# Patient Record
Sex: Female | Born: 1965 | Race: White | Hispanic: No | Marital: Single | Smoking: Former smoker
Health system: Southern US, Community
[De-identification: ages and names within clinical notes are randomized; demographics above are authoritative.]

## PROBLEM LIST (undated history)

## (undated) DIAGNOSIS — H409 Unspecified glaucoma: Secondary | ICD-10-CM

## (undated) DIAGNOSIS — G20 Parkinson's disease: Secondary | ICD-10-CM

## (undated) DIAGNOSIS — F32A Depression, unspecified: Secondary | ICD-10-CM

## (undated) DIAGNOSIS — F259 Schizoaffective disorder, unspecified: Secondary | ICD-10-CM

## (undated) DIAGNOSIS — J45909 Unspecified asthma, uncomplicated: Secondary | ICD-10-CM

## (undated) DIAGNOSIS — I214 Non-ST elevation (NSTEMI) myocardial infarction: Secondary | ICD-10-CM

## (undated) DIAGNOSIS — F250 Schizoaffective disorder, bipolar type: Secondary | ICD-10-CM

## (undated) DIAGNOSIS — F4310 Post-traumatic stress disorder, unspecified: Secondary | ICD-10-CM

## (undated) DIAGNOSIS — Z87891 Personal history of nicotine dependence: Secondary | ICD-10-CM

## (undated) DIAGNOSIS — F29 Unspecified psychosis not due to a substance or known physiological condition: Secondary | ICD-10-CM

## (undated) DIAGNOSIS — I10 Essential (primary) hypertension: Secondary | ICD-10-CM

## (undated) DIAGNOSIS — F419 Anxiety disorder, unspecified: Secondary | ICD-10-CM

## (undated) DIAGNOSIS — F329 Major depressive disorder, single episode, unspecified: Secondary | ICD-10-CM

## (undated) HISTORY — PX: BACK SURGERY: SHX140

## (undated) HISTORY — PX: APPENDECTOMY: SHX54

## (undated) HISTORY — PX: CHOLECYSTECTOMY: SHX55

---

## 2015-11-19 ENCOUNTER — Emergency Department (HOSPITAL_COMMUNITY): Payer: Medicaid Other

## 2015-11-19 ENCOUNTER — Observation Stay (HOSPITAL_COMMUNITY)
Admission: EM | Admit: 2015-11-19 | Discharge: 2015-11-21 | Disposition: A | Discharge status: Skilled Nursing Facility | Payer: Medicaid Other | Attending: Internal Medicine | Admitting: Internal Medicine

## 2015-11-19 ENCOUNTER — Encounter (HOSPITAL_COMMUNITY): Payer: Self-pay

## 2015-11-19 DIAGNOSIS — D649 Anemia, unspecified: Secondary | ICD-10-CM | POA: Diagnosis not present

## 2015-11-19 DIAGNOSIS — IMO0001 Reserved for inherently not codable concepts without codable children: Secondary | ICD-10-CM

## 2015-11-19 DIAGNOSIS — I2510 Atherosclerotic heart disease of native coronary artery without angina pectoris: Secondary | ICD-10-CM

## 2015-11-19 DIAGNOSIS — R000 Tachycardia, unspecified: Secondary | ICD-10-CM | POA: Diagnosis not present

## 2015-11-19 DIAGNOSIS — Z87891 Personal history of nicotine dependence: Secondary | ICD-10-CM | POA: Insufficient documentation

## 2015-11-19 DIAGNOSIS — I2489 Other forms of acute ischemic heart disease: Secondary | ICD-10-CM

## 2015-11-19 DIAGNOSIS — F329 Major depressive disorder, single episode, unspecified: Secondary | ICD-10-CM | POA: Diagnosis not present

## 2015-11-19 DIAGNOSIS — R778 Other specified abnormalities of plasma proteins: Secondary | ICD-10-CM | POA: Insufficient documentation

## 2015-11-19 DIAGNOSIS — H409 Unspecified glaucoma: Secondary | ICD-10-CM | POA: Diagnosis not present

## 2015-11-19 DIAGNOSIS — E878 Other disorders of electrolyte and fluid balance, not elsewhere classified: Secondary | ICD-10-CM

## 2015-11-19 DIAGNOSIS — E876 Hypokalemia: Secondary | ICD-10-CM | POA: Insufficient documentation

## 2015-11-19 DIAGNOSIS — I10 Essential (primary) hypertension: Secondary | ICD-10-CM | POA: Insufficient documentation

## 2015-11-19 DIAGNOSIS — I248 Other forms of acute ischemic heart disease: Secondary | ICD-10-CM

## 2015-11-19 DIAGNOSIS — R079 Chest pain, unspecified: Secondary | ICD-10-CM | POA: Diagnosis present

## 2015-11-19 DIAGNOSIS — F259 Schizoaffective disorder, unspecified: Secondary | ICD-10-CM | POA: Insufficient documentation

## 2015-11-19 DIAGNOSIS — I252 Old myocardial infarction: Secondary | ICD-10-CM | POA: Diagnosis not present

## 2015-11-19 DIAGNOSIS — R030 Elevated blood-pressure reading, without diagnosis of hypertension: Secondary | ICD-10-CM

## 2015-11-19 DIAGNOSIS — R0789 Other chest pain: Secondary | ICD-10-CM | POA: Diagnosis not present

## 2015-11-19 DIAGNOSIS — F419 Anxiety disorder, unspecified: Secondary | ICD-10-CM

## 2015-11-19 DIAGNOSIS — G20 Parkinson's disease: Secondary | ICD-10-CM | POA: Diagnosis not present

## 2015-11-19 DIAGNOSIS — G20A1 Parkinson's disease without dyskinesia, without mention of fluctuations: Secondary | ICD-10-CM

## 2015-11-19 DIAGNOSIS — R7989 Other specified abnormal findings of blood chemistry: Secondary | ICD-10-CM

## 2015-11-19 HISTORY — DX: Unspecified psychosis not due to a substance or known physiological condition: F29

## 2015-11-19 HISTORY — DX: Personal history of nicotine dependence: Z87.891

## 2015-11-19 HISTORY — DX: Non-ST elevation (NSTEMI) myocardial infarction: I21.4

## 2015-11-19 HISTORY — DX: Depression, unspecified: F32.A

## 2015-11-19 HISTORY — DX: Essential (primary) hypertension: I10

## 2015-11-19 HISTORY — DX: Schizoaffective disorder, bipolar type: F25.0

## 2015-11-19 HISTORY — DX: Unspecified asthma, uncomplicated: J45.909

## 2015-11-19 HISTORY — DX: Unspecified glaucoma: H40.9

## 2015-11-19 HISTORY — DX: Schizoaffective disorder, unspecified: F25.9

## 2015-11-19 HISTORY — DX: Post-traumatic stress disorder, unspecified: F43.10

## 2015-11-19 HISTORY — DX: Major depressive disorder, single episode, unspecified: F32.9

## 2015-11-19 HISTORY — DX: Parkinson's disease: G20

## 2015-11-19 HISTORY — DX: Anxiety disorder, unspecified: F41.9

## 2015-11-19 LAB — CBC
HEMATOCRIT: 28.6 % — AB (ref 36.0–46.0)
HEMOGLOBIN: 9.9 g/dL — AB (ref 12.0–15.0)
MCH: 30.2 pg (ref 26.0–34.0)
MCHC: 34.6 g/dL (ref 30.0–36.0)
MCV: 87.2 fL (ref 78.0–100.0)
Platelets: 174 10*3/uL (ref 150–400)
RBC: 3.28 MIL/uL — AB (ref 3.87–5.11)
RDW: 13.1 % (ref 11.5–15.5)
WBC: 6.3 10*3/uL (ref 4.0–10.5)

## 2015-11-19 LAB — BASIC METABOLIC PANEL
ANION GAP: 3 — AB (ref 5–15)
BUN: 8 mg/dL (ref 6–20)
CHLORIDE: 119 mmol/L — AB (ref 101–111)
CO2: 18 mmol/L — ABNORMAL LOW (ref 22–32)
Calcium: 5.8 mg/dL — CL (ref 8.9–10.3)
Creatinine, Ser: 0.36 mg/dL — ABNORMAL LOW (ref 0.44–1.00)
GFR calc Af Amer: >60 mL/min (ref >60)
Glucose, Bld: 82 mg/dL (ref 65–99)
POTASSIUM: 2.7 mmol/L — AB (ref 3.5–5.1)
SODIUM: 140 mmol/L (ref 135–145)

## 2015-11-19 LAB — TROPONIN I
Troponin I: 0.14 ng/mL (ref <0.03)
Troponin I: 0.14 ng/mL (ref <0.03)
Troponin I: 0.14 ng/mL (ref <0.03)

## 2015-11-19 LAB — HEPATIC FUNCTION PANEL
ALT: 11 U/L — ABNORMAL LOW (ref 14–54)
AST: 14 U/L — ABNORMAL LOW (ref 15–41)
Albumin: 4.3 g/dL (ref 3.5–5.0)
Alkaline Phosphatase: 133 U/L — ABNORMAL HIGH (ref 38–126)
Bilirubin, Direct: 0.1 mg/dL (ref 0.1–0.5)
Indirect Bilirubin: 0.7 mg/dL (ref 0.3–0.9)
Total Bilirubin: 0.8 mg/dL (ref 0.3–1.2)
Total Protein: 8.4 g/dL — ABNORMAL HIGH (ref 6.5–8.1)

## 2015-11-19 LAB — URINALYSIS, ROUTINE W REFLEX MICROSCOPIC
Bilirubin Urine: NEGATIVE
Glucose, UA: NEGATIVE mg/dL
Hgb urine dipstick: NEGATIVE
Ketones, ur: NEGATIVE mg/dL
Nitrite: NEGATIVE
Protein, ur: NEGATIVE mg/dL
Specific Gravity, Urine: 1.01 (ref 1.005–1.030)
pH: 6.5 (ref 5.0–8.0)

## 2015-11-19 LAB — URINE MICROSCOPIC-ADD ON: RBC / HPF: NONE SEEN RBC/hpf (ref 0–5)

## 2015-11-19 LAB — MAGNESIUM
Magnesium: 1.3 mg/dL — ABNORMAL LOW (ref 1.7–2.4)
Magnesium: 2 mg/dL (ref 1.7–2.4)

## 2015-11-19 LAB — POC OCCULT BLOOD, ED: FECAL OCCULT BLD: NEGATIVE

## 2015-11-19 LAB — ALBUMIN: Albumin: 2.4 g/dL — ABNORMAL LOW (ref 3.5–5.0)

## 2015-11-19 MED ORDER — BACLOFEN 10 MG PO TABS
5.0000 mg | ORAL_TABLET | Freq: Three times a day (TID) | ORAL | Status: DC
  Administered 2015-11-19 – 2015-11-21 (×5): 5 mg via ORAL
  Filled 2015-11-19 (×5): qty 1

## 2015-11-19 MED ORDER — LORAZEPAM 2 MG/ML IJ SOLN
1.0000 mg | Freq: Once | INTRAMUSCULAR | Status: AC
  Administered 2015-11-19: 1 mg via INTRAVENOUS
  Filled 2015-11-19: qty 1

## 2015-11-19 MED ORDER — POTASSIUM CHLORIDE 10 MEQ/100ML IV SOLN
10.0000 meq | INTRAVENOUS | Status: AC
  Administered 2015-11-19 (×3): 10 meq via INTRAVENOUS
  Filled 2015-11-19 (×3): qty 100

## 2015-11-19 MED ORDER — NITROGLYCERIN 0.4 MG SL SUBL: 0.4000 mg | SUBLINGUAL_TABLET | SUBLINGUAL | Status: DC | PRN

## 2015-11-19 MED ORDER — LORAZEPAM 0.5 MG PO TABS
0.5000 mg | ORAL_TABLET | Freq: Four times a day (QID) | ORAL | Status: DC | PRN
  Administered 2015-11-19 – 2015-11-20 (×2): 0.5 mg via ORAL
  Filled 2015-11-19 (×2): qty 1

## 2015-11-19 MED ORDER — RISAQUAD PO CAPS
1.0000 | ORAL_CAPSULE | Freq: Three times a day (TID) | ORAL | Status: DC
  Administered 2015-11-19 – 2015-11-21 (×5): 1 via ORAL
  Filled 2015-11-19 (×5): qty 1

## 2015-11-19 MED ORDER — IOPAMIDOL (ISOVUE-370) INJECTION 76%
100.0000 mL | Freq: Once | INTRAVENOUS | Status: AC | PRN
  Administered 2015-11-19: 100 mL via INTRAVENOUS

## 2015-11-19 MED ORDER — GABAPENTIN 300 MG PO CAPS
600.0000 mg | ORAL_CAPSULE | Freq: Three times a day (TID) | ORAL | Status: DC
  Administered 2015-11-19 – 2015-11-21 (×5): 600 mg via ORAL
  Filled 2015-11-19 (×5): qty 2

## 2015-11-19 MED ORDER — METOPROLOL TARTRATE 25 MG PO TABS
12.5000 mg | ORAL_TABLET | Freq: Two times a day (BID) | ORAL | Status: DC
  Administered 2015-11-19 – 2015-11-21 (×4): 12.5 mg via ORAL
  Filled 2015-11-19 (×4): qty 1

## 2015-11-19 MED ORDER — POTASSIUM CHLORIDE CRYS ER 20 MEQ PO TBCR
40.0000 meq | EXTENDED_RELEASE_TABLET | Freq: Two times a day (BID) | ORAL | Status: AC
  Administered 2015-11-19 – 2015-11-20 (×2): 40 meq via ORAL
  Filled 2015-11-19 (×2): qty 2

## 2015-11-19 MED ORDER — LATANOPROST 0.005 % OP SOLN
OPHTHALMIC | Status: AC
  Filled 2015-11-19: qty 2.5

## 2015-11-19 MED ORDER — SODIUM CHLORIDE 0.9 % IV SOLN
1.0000 g | Freq: Once | INTRAVENOUS | Status: AC
  Administered 2015-11-19: 1 g via INTRAVENOUS
  Filled 2015-11-19 (×2): qty 10

## 2015-11-19 MED ORDER — MAGNESIUM SULFATE 2 GM/50ML IV SOLN
2.0000 g | Freq: Once | INTRAVENOUS | Status: AC
  Administered 2015-11-19: 2 g via INTRAVENOUS
  Filled 2015-11-19: qty 50

## 2015-11-19 MED ORDER — DORZOLAMIDE HCL-TIMOLOL MAL 2-0.5 % OP SOLN
OPHTHALMIC | Status: AC
  Filled 2015-11-19: qty 10

## 2015-11-19 MED ORDER — CARBIDOPA-LEVODOPA 25-100 MG PO TABS
2.0000 | ORAL_TABLET | Freq: Three times a day (TID) | ORAL | Status: DC
  Administered 2015-11-19 – 2015-11-21 (×5): 2 via ORAL
  Filled 2015-11-19 (×4): qty 2
  Filled 2015-11-19: qty 1

## 2015-11-19 MED ORDER — POTASSIUM CHLORIDE 10 MEQ/100ML IV SOLN
10.0000 meq | INTRAVENOUS | Status: AC
  Administered 2015-11-19 – 2015-11-20 (×4): 10 meq via INTRAVENOUS
  Filled 2015-11-19 (×4): qty 100

## 2015-11-19 MED ORDER — METOPROLOL TARTRATE 5 MG/5ML IV SOLN
5.0000 mg | Freq: Once | INTRAVENOUS | Status: AC
  Administered 2015-11-19: 5 mg via INTRAVENOUS
  Filled 2015-11-19: qty 5

## 2015-11-19 MED ORDER — TRAMADOL HCL 50 MG PO TABS
50.0000 mg | ORAL_TABLET | Freq: Two times a day (BID) | ORAL | Status: DC
  Administered 2015-11-19 – 2015-11-21 (×4): 50 mg via ORAL
  Filled 2015-11-19 (×4): qty 1

## 2015-11-19 MED ORDER — ACETAMINOPHEN 325 MG PO TABS: 650.0000 mg | ORAL_TABLET | ORAL | Status: DC | PRN

## 2015-11-19 MED ORDER — LATANOPROST 0.005 % OP SOLN
1.0000 [drp] | Freq: Every day | OPHTHALMIC | Status: DC
  Administered 2015-11-19 – 2015-11-20 (×2): 1 [drp] via OPHTHALMIC
  Filled 2015-11-19: qty 2.5

## 2015-11-19 MED ORDER — PALIPERIDONE ER 6 MG PO TB24
12.0000 mg | ORAL_TABLET | Freq: Every day | ORAL | Status: DC
  Administered 2015-11-20 – 2015-11-21 (×2): 12 mg via ORAL
  Filled 2015-11-19 (×4): qty 2

## 2015-11-19 MED ORDER — SODIUM CHLORIDE 0.9 % IV SOLN
1.0000 g | Freq: Once | INTRAVENOUS | Status: AC
  Administered 2015-11-19: 1 g via INTRAVENOUS
  Filled 2015-11-19: qty 10

## 2015-11-19 MED ORDER — POTASSIUM CHLORIDE 10 MEQ/50ML IV SOLN: 10.0000 meq | INTRAVENOUS | Status: DC | PRN

## 2015-11-19 MED ORDER — QUETIAPINE FUMARATE 25 MG PO TABS
50.0000 mg | ORAL_TABLET | Freq: Two times a day (BID) | ORAL | Status: DC
  Administered 2015-11-19 – 2015-11-21 (×4): 50 mg via ORAL
  Filled 2015-11-19 (×4): qty 2

## 2015-11-19 MED ORDER — ONDANSETRON HCL 4 MG/2ML IJ SOLN: 4.0000 mg | Freq: Four times a day (QID) | INTRAMUSCULAR | Status: DC | PRN

## 2015-11-19 MED ORDER — DULOXETINE HCL 60 MG PO CPEP
60.0000 mg | ORAL_CAPSULE | Freq: Every day | ORAL | Status: DC
  Administered 2015-11-20 – 2015-11-21 (×2): 60 mg via ORAL
  Filled 2015-11-19 (×3): qty 1

## 2015-11-19 MED ORDER — DORZOLAMIDE HCL 2 % OP SOLN
1.0000 [drp] | Freq: Three times a day (TID) | OPHTHALMIC | Status: DC
  Administered 2015-11-20 – 2015-11-21 (×4): 1 [drp] via OPHTHALMIC
  Filled 2015-11-19: qty 10

## 2015-11-19 MED ORDER — DOCUSATE SODIUM 100 MG PO CAPS
100.0000 mg | ORAL_CAPSULE | Freq: Two times a day (BID) | ORAL | Status: DC
  Administered 2015-11-19 – 2015-11-21 (×4): 100 mg via ORAL
  Filled 2015-11-19 (×4): qty 1

## 2015-11-19 MED ORDER — TRAMADOL HCL 50 MG PO TABS: 50.0000 mg | ORAL_TABLET | Freq: Every day | ORAL | Status: DC | PRN

## 2015-11-19 MED ORDER — POTASSIUM CHLORIDE CRYS ER 20 MEQ PO TBCR
20.0000 meq | EXTENDED_RELEASE_TABLET | ORAL | Status: DC | PRN
  Administered 2015-11-19: 20 meq via ORAL
  Filled 2015-11-19: qty 1

## 2015-11-19 MED ORDER — ASPIRIN 325 MG PO TABS
325.0000 mg | ORAL_TABLET | Freq: Every day | ORAL | Status: DC
  Administered 2015-11-19 – 2015-11-21 (×3): 325 mg via ORAL
  Filled 2015-11-19 (×3): qty 1

## 2015-11-19 MED ORDER — ENOXAPARIN SODIUM 40 MG/0.4ML ~~LOC~~ SOLN
40.0000 mg | SUBCUTANEOUS | Status: DC
  Administered 2015-11-19: 40 mg via SUBCUTANEOUS
  Filled 2015-11-19: qty 0.4

## 2015-11-19 MED ORDER — SODIUM CHLORIDE 0.9 % IV SOLN
1000.0000 mL | Freq: Once | INTRAVENOUS | Status: AC
  Administered 2015-11-19: 1000 mL via INTRAVENOUS

## 2015-11-19 NOTE — ED Triage Notes (Signed)
[facility]. Pt was given two NTG and ASA 324 mg prior to arrival.

## 2015-11-19 NOTE — ED Notes (Signed)
CRITICAL VALUE ALERT  Critical value received:  K 2.7  Ca-5.8                Troponin 0.14  Date of notification:  11/19/2015  Time of notification:  1456    Nurse who received alert:  Gaetano HawthorneJJ Quientin Jent  MD notified (1st page):  Dr. Particia NearingHaviland

## 2015-11-19 NOTE — ED Provider Notes (Signed)
[facility]-EMERGENCY DEPT Provider Note   CSN: [id number] Arrival date & time: 11/19/15  1310  First Provider Contact:  First MD Initiated Contact with Patient 11/19/15 1305        History   Chief Complaint Chief Complaint  Patient presents with  . Chest Pain    HPI Brianna Cunningham is a 50 y.o. female.  Pt has severe parkinson's disease and is a resident of a local nursing home.  She told the snf that she has cp.  She said that she also felt like her heart was flying.  She was given nitro and asa by ems which did help her cp.  The pt said that her cp is better now.  Pt said she had a stress test about 2 years ago at [facility] that was nl.  She has never had a stress test.    Chest Pain   Associated symptoms include palpitations.    Past Medical History:  Diagnosis Date  . Depression   . Parkinson disease (HCC)   . Psychosis   . PTSD (post-traumatic stress disorder)   . Schizo affective schizophrenia [facility])     Patient Active Problem List   Diagnosis Date Noted  . Chest pain 11/19/2015    Past Surgical History:  Procedure Laterality Date  . APPENDECTOMY    . BACK SURGERY    . CESAREAN SECTION    . CHOLECYSTECTOMY      OB History    No data available       Home Medications    Prior to Admission medications   Medication Sig Start Date End Date Taking? Authorizing Provider  acidophilus (RISAQUAD) CAPS capsule Take 1 capsule by mouth 3 (three) times daily.   Yes Historical Provider, MD  baclofen (LIORESAL) 10 MG tablet Take 5 mg by mouth 3 (three) times daily.   Yes Historical Provider, MD  carbidopa-levodopa (SINEMET IR) 25-100 MG tablet Take 2 tablets by mouth 3 (three) times daily.   Yes Historical Provider, MD  docusate sodium (COLACE) 100 MG capsule Take 100 mg by mouth 2 (two) times daily.   Yes Historical Provider, MD  dorzolamide (TRUSOPT) 2 % ophthalmic solution Place 1 drop into both eyes 3 (three) times daily.   Yes Historical Provider, MD  DULoxetine  (CYMBALTA) 60 MG capsule Take 60 mg by mouth daily.   Yes Historical Provider, MD  gabapentin (NEURONTIN) 600 MG tablet Take 600 mg by mouth 3 (three) times daily.   Yes Historical Provider, MD  latanoprost (XALATAN) 0.005 % ophthalmic solution Place 1 drop into both eyes at bedtime.   Yes Historical Provider, MD  LORazepam (ATIVAN) 0.5 MG tablet Take 0.5 mg by mouth every 6 (six) hours as needed for anxiety.   Yes Historical Provider, MD  metoprolol tartrate (LOPRESSOR) 25 MG tablet Take 12.5 mg by mouth 2 (two) times daily.   Yes Historical Provider, MD  nitroGLYCERIN (NITROSTAT) 0.4 MG SL tablet Place 0.4 mg under the tongue every 5 (five) minutes as needed for chest pain.   Yes Historical Provider, MD  paliperidone (INVEGA) 6 MG 24 hr tablet Take 12 mg by mouth daily.   Yes Historical Provider, MD  QUEtiapine (SEROQUEL) 50 MG tablet Take 50 mg by mouth 2 (two) times daily.   Yes Historical Provider, MD  traMADol (ULTRAM) 50 MG tablet Take 50 mg by mouth 2 (two) times daily.   Yes Historical Provider, MD  traMADol (ULTRAM) 50 MG tablet Take 50 mg by mouth daily as needed  for moderate pain.   Yes Historical Provider, MD  Vitamin D, Ergocalciferol, (DRISDOL) 50000 units CAPS capsule Take 50,000 Units by mouth every 7 (seven) days. On Wednesday's.   Yes Historical Provider, MD    Family History No family history on file.  Social History Social History  Substance Use Topics  . Smoking status: Former Games developer  . Smokeless tobacco: Never Used  . Alcohol use No     Allergies   Fish-derived products   Review of Systems Review of Systems  Cardiovascular: Positive for chest pain and palpitations.  All other systems reviewed and are negative.    Physical Exam Updated Vital Signs BP (!) 173/102   Pulse 88   Temp 98.8 F (37.1 C) (Rectal)   Resp 15   LMP  (LMP Unknown) Comment: post menopausal  SpO2 100%   Physical Exam  Constitutional: She is oriented to person, place, and time.  She appears well-developed and well-nourished. She appears distressed.  HENT:  Head: Normocephalic and atraumatic.  Right Ear: External ear normal.  Left Ear: External ear normal.  Nose: Nose normal.  Mouth/Throat: Oropharynx is clear and moist.  Eyes: Conjunctivae and EOM are normal. Pupils are equal, round, and reactive to light.  Neck: Normal range of motion. Neck supple.  Cardiovascular: Regular rhythm.  Tachycardia present.   Pulmonary/Chest: Effort normal and breath sounds normal.  Abdominal: Soft. Bowel sounds are normal.  Genitourinary: Rectal exam shows guaiac negative stool.  Musculoskeletal: Normal range of motion.  Neurological: She is alert and oriented to person, place, and time.  Skin: Skin is warm and dry.     Psychiatric: Her behavior is normal. Judgment and thought content normal. Her mood appears anxious.  Nursing note and vitals reviewed.    ED Treatments / Results  Labs (all labs ordered are listed, but only abnormal results are displayed) Labs Reviewed  BASIC METABOLIC PANEL - Abnormal; Notable for the following:       Result Value   Potassium 2.7 (*)    Chloride 119 (*)    CO2 18 (*)    Creatinine, Ser 0.36 (*)    Calcium 5.8 (*)    Anion gap 3 (*)    All other components within normal limits  CBC - Abnormal; Notable for the following:    RBC 3.28 (*)    Hemoglobin 9.9 (*)    HCT 28.6 (*)    All other components within normal limits  TROPONIN I - Abnormal; Notable for the following:    Troponin I 0.14 (*)    All other components within normal limits  POC OCCULT BLOOD, ED    EKG  EKG Interpretation  Date/Time:  Monday November 19 2015 13:28:50 EDT Ventricular Rate:  104 PR Interval:    QRS Duration: 95 QT Interval:  336 QTC Calculation: 442 R Axis:   87 Text Interpretation:  Sinus tachycardia LAE, consider biatrial enlargement Confirmed by Particia Nearing MD, Delesia Martinek ([id number]) on 11/19/2015 1:47:47 PM       Radiology Dg Chest 2 View  Result  Date: 11/19/2015 CLINICAL DATA:  Chest pain. EXAM: CHEST  2 VIEW COMPARISON:  None. FINDINGS: Hyperexpansion is consistent with emphysema. The cardiopericardial silhouette is within normal limits for size. Bones are diffusely demineralized. Patient is status post extensive thoracolumbar fusion. Telemetry leads overlie the chest. IMPRESSION: Emphysema without acute cardiopulmonary process. Electronically Signed   By: Kennith Center M.D.   On: 11/19/2015 13:48   Ct Angio Chest Pe W And/or Wo Contrast  Result Date: 11/19/2015 CLINICAL DATA:  Acute chest pain. EXAM: CT ANGIOGRAPHY CHEST WITH CONTRAST TECHNIQUE: Multidetector CT imaging of the chest was performed using the standard protocol during bolus administration of intravenous contrast. Multiplanar CT image reconstructions and MIPs were obtained to evaluate the vascular anatomy. CONTRAST:  100 mL of Isovue 370 intravenously. COMPARISON:  Radiographs of same day. FINDINGS: Status post Harrington rod fixation of thoracic spine. No pneumothorax or pleural effusion is noted. 5 mm subpleural nodule is noted in superior segment of left lower lobe best seen on image number 74 series 6. 8 mm subpleural nodule is seen posteriorly in right lower lobe best seen on image number 72 of series 6. There is no definite evidence of pulmonary embolus. There is no evidence of thoracic aortic dissection or aneurysm. No mediastinal mass or adenopathy is noted. Visualized portion of upper abdomen is unremarkable. Review of the MIP images confirms the above findings. IMPRESSION: No evidence of pulmonary embolus. 5 mm subpleural nodule noted in left lower lobe, with 8 mm subpleural nodule seen posteriorly in right lower lobe. Non-contrast chest CT at 6-12 months is recommended. If the nodule is stable at time of repeat CT, then future CT at 18-24 months (from today's scan) is considered optional for low-risk patients, but is recommended for high-risk patients. This recommendation follows  the consensus statement: Guidelines for Management of Incidental Pulmonary Nodules Detected on CT Images:From the Fleischner Society 2017; published online before print (10.1148/radiol.1610960454). Electronically Signed   By: Lupita Raider, M.D.   On: 11/19/2015 15:40    Procedures Procedures (including critical care time)  Medications Ordered in ED Medications  potassium chloride SA (K-DUR,KLOR-CON) CR tablet 20 mEq (20 mEq Oral Given 11/19/15 1628)  potassium chloride 10 mEq in 100 mL IVPB (10 mEq Intravenous New Bag/Given 11/19/15 1724)  0.9 %  sodium chloride infusion (0 mLs Intravenous Stopped 11/19/15 1535)  LORazepam (ATIVAN) injection 1 mg (1 mg Intravenous Given 11/19/15 1327)  LORazepam (ATIVAN) injection 1 mg (1 mg Intravenous Given 11/19/15 1447)  iopamidol (ISOVUE-370) 76 % injection 100 mL (100 mLs Intravenous Contrast Given 11/19/15 1514)  calcium gluconate 1 g in sodium chloride 0.9 % 100 mL IVPB (0 g Intravenous Stopped 11/19/15 1710)  metoprolol (LOPRESSOR) injection 5 mg (5 mg Intravenous Given 11/19/15 1617)     Initial Impression / Assessment and Plan / ED Course  I have reviewed the triage vital signs and the nursing notes.  Pertinent labs & imaging results that were available during my care of the patient were reviewed by me and considered in my medical decision making (see chart for details).  Clinical Course  Value Comment By Time  Calcium: (!!) 5.8 (Reviewed) Jacalyn Lefevre, MD 08/07 1556  Calcium: (!!) 5.8 (Reviewed) Jacalyn Lefevre, MD 08/07 1556   Pt was d/w Dr. Dartha Lodge ([facility]) who asked that I call the cardiologist to see if they want pt at [facility] or if she is ok to stay here.  I spoke with Dr. Allyson Sabal (cardiology) who suggested that she stay here as her cp is gone and she is stable.  If troponins bump, then she can go there.  He rec heparin and nitro.   Final Clinical Impressions(s) / ED Diagnoses   Final diagnoses:  Chest pain, unspecified chest pain type    Hypokalemia  Hypocalcemia  Anemia, unspecified anemia type  Anxiety  Sinus tachycardia (HCC)    New Prescriptions New Prescriptions   No medications on file  Jacalyn LefevreJulie Kennady Zimmerle, MD 11/19/15 2017

## 2015-11-19 NOTE — Progress Notes (Signed)
Call received from Lab. Critical Troponin level 0.14. Dr. Clarnce FlockS. Obgata had been notified.

## 2015-11-19 NOTE — ED Notes (Signed)
CRITICAL VALUE ALERT  Critical value received:  Troponin 0.04  Date of notification:  11/19/2015  Time of notification: [id number]    Nurse who received alert:  JJ Elisse Pennick  Dr. Particia NearingHaviland notified in ER

## 2015-11-19 NOTE — H&P (Signed)
History and Physical  Brianna Cunningham ZOX:[id number] DOB: 01-Nov-1965 DOA: 11/19/2015  Referring physician: ER Physician PCP: No PCP Per Patient  Outpatient Specialists:    Patient coming from: Facility  Chief Complaint: Chest pain  HPI: 50 year old female with psychiatry history, parkinsonian features, prior NSTEMI around March of this year with no further work up as patient was deemed not a good candidate for intervention. Patient was advised to continue medical treatment of NSTEMI. Patient presents with chest pain that started around 11am today. Chest pain was described as heaviness and tightness of the chest but non radiating. Reports associated nausea, SOB and diaphoresis. Chest heaviness is said to be intermittent and would only last for few seconds. First troponin was 0.14. Potassium of 2.7 is noted, calcium of 5.8 and hemoglobin of 9.9. CTA chest is negative for PE, but revealed 5mm and 8mm subpleural nodules. EKG revealed sinus tachycardia with bilateral atrial enlargement.  ED Course: Aspirin  Pertinent labs: As above EKG: Independently reviewed.   Review of Systems:  As in HPI. Negative for fever, visual changes, sore throat, rash, new muscle aches, dysuria, bleeding, v/abdominal pain.  Past Medical History:  Diagnosis Date  . Depression   . Parkinson disease (HCC)   . Psychosis   . PTSD (post-traumatic stress disorder)   . Schizo affective schizophrenia [facility](HCC)     Past Surgical History:  Procedure Laterality Date  . APPENDECTOMY    . BACK SURGERY    . CESAREAN SECTION    . CHOLECYSTECTOMY       reports that she has quit smoking. She has never used smokeless tobacco. She reports that she does not drink alcohol. Her drug history is not on file.  Allergies  Allergen Reactions  . Fish-Derived Products Other (See Comments)    unknown    No family history on file.   Prior to Admission medications   Medication Sig Start Date End Date Taking? Authorizing Provider    acidophilus (RISAQUAD) CAPS capsule Take 1 capsule by mouth 3 (three) times daily.   Yes Historical Provider, MD  baclofen (LIORESAL) 10 MG tablet Take 5 mg by mouth 3 (three) times daily.   Yes Historical Provider, MD  carbidopa-levodopa (SINEMET IR) 25-100 MG tablet Take 2 tablets by mouth 3 (three) times daily.   Yes Historical Provider, MD  docusate sodium (COLACE) 100 MG capsule Take 100 mg by mouth 2 (two) times daily.   Yes Historical Provider, MD  dorzolamide (TRUSOPT) 2 % ophthalmic solution Place 1 drop into both eyes 3 (three) times daily.   Yes Historical Provider, MD  DULoxetine (CYMBALTA) 60 MG capsule Take 60 mg by mouth daily.   Yes Historical Provider, MD  gabapentin (NEURONTIN) 600 MG tablet Take 600 mg by mouth 3 (three) times daily.   Yes Historical Provider, MD  latanoprost (XALATAN) 0.005 % ophthalmic solution Place 1 drop into both eyes at bedtime.   Yes Historical Provider, MD  LORazepam (ATIVAN) 0.5 MG tablet Take 0.5 mg by mouth every 6 (six) hours as needed for anxiety.   Yes Historical Provider, MD  metoprolol tartrate (LOPRESSOR) 25 MG tablet Take 12.5 mg by mouth 2 (two) times daily.   Yes Historical Provider, MD  nitroGLYCERIN (NITROSTAT) 0.4 MG SL tablet Place 0.4 mg under the tongue every 5 (five) minutes as needed for chest pain.   Yes Historical Provider, MD  paliperidone (INVEGA) 6 MG 24 hr tablet Take 12 mg by mouth daily.   Yes Historical Provider, MD  QUEtiapine (SEROQUEL) 50 MG tablet Take 50 mg by mouth 2 (two) times daily.   Yes Historical Provider, MD  traMADol (ULTRAM) 50 MG tablet Take 50 mg by mouth 2 (two) times daily.   Yes Historical Provider, MD  traMADol (ULTRAM) 50 MG tablet Take 50 mg by mouth daily as needed for moderate pain.   Yes Historical Provider, MD  Vitamin D, Ergocalciferol, (DRISDOL) 50000 units CAPS capsule Take 50,000 Units by mouth every 7 (seven) days. On Wednesday's.   Yes Historical Provider, MD    Physical Exam: Vitals:    11/19/15 1620 11/19/15 1621 11/19/15 1630 11/19/15 1700  BP: 140/96  (!) 154/104 (!) 173/102  Pulse:  87 82 88  Resp: 23 24 14 15   Temp:      TempSrc:      SpO2:  100% 100% 100%    Constitutional:  . Appears calm and comfortable. Parkinsonian facie and features Eyes:  . No pallor. No jaundice.  ENMT:  . external ears, nose appear normal Neck:  . Neck is supple. No JVD Respiratory:  . CTA bilaterally, no w/r/r.  . Respiratory effort normal. No retractions or accessory muscle use Cardiovascular:  . S1S2 . No LE extremity edema   Abdomen:  . Abdomen is soft and non tender. Organs are difficult to assess. Neurologic:  . Awake and alert. . Moves all limbs.  Wt Readings from Last 3 Encounters:  No data found for Wt    I have personally reviewed following labs and imaging studies  Labs on Admission:  CBC:  Recent Labs Lab 11/19/15 1357  WBC 6.3  HGB 9.9*  HCT 28.6*  MCV 87.2  PLT 174   Basic Metabolic Panel:  Recent Labs Lab 11/19/15 1357  NA 140  K 2.7*  CL 119*  CO2 18*  GLUCOSE 82  BUN 8  CREATININE 0.36*  CALCIUM 5.8*   Liver Function Tests: No results for input(s): AST, ALT, ALKPHOS, BILITOT, PROT, ALBUMIN in the last 168 hours. No results for input(s): LIPASE, AMYLASE in the last 168 hours. No results for input(s): AMMONIA in the last 168 hours. Coagulation Profile: No results for input(s): INR, PROTIME in the last 168 hours. Cardiac Enzymes:  Recent Labs Lab 11/19/15 1357  TROPONINI 0.14*   BNP (last 3 results) No results for input(s): PROBNP in the last 8760 hours. HbA1C: No results for input(s): HGBA1C in the last 72 hours. CBG: No results for input(s): GLUCAP in the last 168 hours. Lipid Profile: No results for input(s): CHOL, HDL, LDLCALC, TRIG, CHOLHDL, LDLDIRECT in the last 72 hours. Thyroid Function Tests: No results for input(s): TSH, T4TOTAL, FREET4, T3FREE, THYROIDAB in the last 72 hours. Anemia Panel: No results for  input(s): VITAMINB12, FOLATE, FERRITIN, TIBC, IRON, RETICCTPCT in the last 72 hours. Urine analysis: No results found for: COLORURINE, APPEARANCEUR, LABSPEC, PHURINE, GLUCOSEU, HGBUR, BILIRUBINUR, KETONESUR, PROTEINUR, UROBILINOGEN, NITRITE, LEUKOCYTESUR Sepsis Labs: @LABRCNTIP (procalcitonin:4,lacticidven:4) )No results found for this or any previous visit (from the past 240 hour(s)).    Radiological Exams on Admission: Dg Chest 2 View  Result Date: 11/19/2015 CLINICAL DATA:  Chest pain. EXAM: CHEST  2 VIEW COMPARISON:  None. FINDINGS: Hyperexpansion is consistent with emphysema. The cardiopericardial silhouette is within normal limits for size. Bones are diffusely demineralized. Patient is status post extensive thoracolumbar fusion. Telemetry leads overlie the chest. IMPRESSION: Emphysema without acute cardiopulmonary process. Electronically Signed   By: Kennith Center M.D.   On: 11/19/2015 13:48   Ct Angio Chest Pe W And/or  Wo Contrast  Result Date: 11/19/2015 CLINICAL DATA:  Acute chest pain. EXAM: CT ANGIOGRAPHY CHEST WITH CONTRAST TECHNIQUE: Multidetector CT imaging of the chest was performed using the standard protocol during bolus administration of intravenous contrast. Multiplanar CT image reconstructions and MIPs were obtained to evaluate the vascular anatomy. CONTRAST:  100 mL of Isovue 370 intravenously. COMPARISON:  Radiographs of same day. FINDINGS: Status post Harrington rod fixation of thoracic spine. No pneumothorax or pleural effusion is noted. 5 mm subpleural nodule is noted in superior segment of left lower lobe best seen on image number 74 series 6. 8 mm subpleural nodule is seen posteriorly in right lower lobe best seen on image number 72 of series 6. There is no definite evidence of pulmonary embolus. There is no evidence of thoracic aortic dissection or aneurysm. No mediastinal mass or adenopathy is noted. Visualized portion of upper abdomen is unremarkable. Review of the MIP  images confirms the above findings. IMPRESSION: No evidence of pulmonary embolus. 5 mm subpleural nodule noted in left lower lobe, with 8 mm subpleural nodule seen posteriorly in right lower lobe. Non-contrast chest CT at 6-12 months is recommended. If the nodule is stable at time of repeat CT, then future CT at 18-24 months (from today's scan) is considered optional for low-risk patients, but is recommended for high-risk patients. This recommendation follows the consensus statement: Guidelines for Management of Incidental Pulmonary Nodules Detected on CT Images:From the Fleischner Society 2017; published online before print (10.1148/radiol.1610960454). Electronically Signed   By: Lupita Raider, M.D.   On: 11/19/2015 15:40    EKG: Independently reviewed.   Active Problems:   Chest pain   Assessment/Plan 1. Chest pain (History of NSTEMI in March 2017 and Medical treatment recommended 2. Hypokalemia 3. Hypomagnesemia 4. Hypocalcemia   Observation  Cycle cardiac enzymes  Please consult Cardiology in ma  Monitor and replace abnormal electrolytes  Further management will depend on hospital course  DVT prophylaxis: [place] Lovenox Code Status: Full Family Communication: None available Disposition Plan: Back to facility   Consults called: Consult Cardiology in am   Admission status: Observation    Time spent: 60 minutes  Berton Mount, MD  [facility] Pager #: [phone] 7PM-7AM contact night coverage as above   11/19/2015, 6:15 PM

## 2015-11-20 ENCOUNTER — Observation Stay (HOSPITAL_BASED_OUTPATIENT_CLINIC_OR_DEPARTMENT_OTHER): Payer: Medicaid Other

## 2015-11-20 ENCOUNTER — Encounter (HOSPITAL_COMMUNITY): Payer: Self-pay | Admitting: Physician Assistant

## 2015-11-20 DIAGNOSIS — R778 Other specified abnormalities of plasma proteins: Secondary | ICD-10-CM

## 2015-11-20 DIAGNOSIS — R079 Chest pain, unspecified: Secondary | ICD-10-CM

## 2015-11-20 DIAGNOSIS — G20 Parkinson's disease: Secondary | ICD-10-CM

## 2015-11-20 DIAGNOSIS — I2510 Atherosclerotic heart disease of native coronary artery without angina pectoris: Secondary | ICD-10-CM

## 2015-11-20 DIAGNOSIS — E878 Other disorders of electrolyte and fluid balance, not elsewhere classified: Secondary | ICD-10-CM

## 2015-11-20 DIAGNOSIS — I248 Other forms of acute ischemic heart disease: Secondary | ICD-10-CM

## 2015-11-20 DIAGNOSIS — R0789 Other chest pain: Secondary | ICD-10-CM

## 2015-11-20 DIAGNOSIS — D649 Anemia, unspecified: Secondary | ICD-10-CM | POA: Diagnosis not present

## 2015-11-20 DIAGNOSIS — IMO0001 Reserved for inherently not codable concepts without codable children: Secondary | ICD-10-CM

## 2015-11-20 DIAGNOSIS — R7989 Other specified abnormal findings of blood chemistry: Secondary | ICD-10-CM | POA: Diagnosis not present

## 2015-11-20 DIAGNOSIS — R030 Elevated blood-pressure reading, without diagnosis of hypertension: Secondary | ICD-10-CM

## 2015-11-20 LAB — BASIC METABOLIC PANEL
Anion gap: 9 (ref 5–15)
BUN: 8 mg/dL (ref 6–20)
CALCIUM: 9 mg/dL (ref 8.9–10.3)
CO2: 19 mmol/L — ABNORMAL LOW (ref 22–32)
CREATININE: 0.6 mg/dL (ref 0.44–1.00)
Chloride: 108 mmol/L (ref 101–111)
GFR calc Af Amer: >60 mL/min (ref >60)
GLUCOSE: 107 mg/dL — AB (ref 65–99)
POTASSIUM: 5.1 mmol/L (ref 3.5–5.1)
SODIUM: 136 mmol/L (ref 135–145)

## 2015-11-20 LAB — CBC
HCT: 39.5 % (ref 36.0–46.0)
Hemoglobin: 13.8 g/dL (ref 12.0–15.0)
MCH: 30.1 pg (ref 26.0–34.0)
MCHC: 34.9 g/dL (ref 30.0–36.0)
MCV: 86.1 fL (ref 78.0–100.0)
PLATELETS: 268 10*3/uL (ref 150–400)
RBC: 4.59 MIL/uL (ref 3.87–5.11)
RDW: 13.4 % (ref 11.5–15.5)
WBC: 7.1 10*3/uL (ref 4.0–10.5)

## 2015-11-20 LAB — ECHOCARDIOGRAM COMPLETE
AVLVOTPG: 3 mmHg
CHL CUP DOP CALC LVOT VTI: 17.6 cm
CHL CUP MV DEC (S): 320
E decel time: 320 msec
EERAT: 20.72
FS: 35 % (ref 28–44)
Height: 65 in
IV/PV OW: 0.99
LA diam end sys: 21 mm
LADIAMINDEX: 1.49 cm/m2
LASIZE: 21 mm
LAVOLA4C: 19.7 mL
LDCA: 2.01 cm2
LV e' LATERAL: 5.55 cm/s
LVEEAVG: 20.72
LVEEMED: 20.72
LVOT SV: 35 mL
LVOT diameter: 16 mm
LVOT peak vel: 87.9 cm/s
Lateral S' vel: 13.7 cm/s
MVPG: 5 mmHg
MVPKAVEL: 127 m/s
MVPKEVEL: 115 m/s
PW: 7.44 mm — AB (ref 0.6–1.1)
TAPSE: 19.2 mm
TDI e' lateral: 5.55
TDI e' medial: 5.11
Weight: 1560 oz

## 2015-11-20 LAB — TROPONIN I: Troponin I: 0.14 ng/mL (ref <0.03)

## 2015-11-20 MED ORDER — LORAZEPAM 0.5 MG PO TABS
0.5000 mg | ORAL_TABLET | ORAL | Status: DC | PRN
  Administered 2015-11-20 – 2015-11-21 (×6): 0.5 mg via ORAL
  Filled 2015-11-20 (×6): qty 1

## 2015-11-20 MED ORDER — ENOXAPARIN SODIUM 30 MG/0.3ML ~~LOC~~ SOLN
30.0000 mg | SUBCUTANEOUS | Status: DC
  Administered 2015-11-20: 30 mg via SUBCUTANEOUS
  Filled 2015-11-20: qty 0.3

## 2015-11-20 NOTE — Progress Notes (Addendum)
PROGRESS NOTE  Brianna Cunningham VQQ:[id number] DOB: 04-Aug-1965 DOA: 11/19/2015 PCP: No PCP Per Patient  Brief Narrative: [age] yof with a PMHx of NSTEMI 06/2015 presented with complaints of chest pain. While in the ED she was noted to be hypokalemic, hypomagnesemic, and hypocalcemic. CTA chest revealed subpleural nodules. She was admitted for further management of chest pain. Cardiology consulted.   Assessment/Plan: 1. Atypical CP. No evidence of ACS. CTA chest no PE. 2. Demand ischemia. Troponins flat. EGK ST, LAE, no previous. Continue ASA. 3. CAD. S/p STEMI 06/2015, reportedly not a candidate for intervention, medical management recommended(?). No records in Epic or Care Everywhere for review. 4. Hypokalemia. Resolved.  5. Hypocalcemia. Resolved.  6. Hypomagnesemia. Resolved.   7. Normocytic anemia. No previous labs for comparison. 8. Parkinson's disease, psychosis, depression, PTSD, schizoaffective schizophrenia. Continue Sinemet, Seroquel, paliperidone. 9. 5 mm subpleural nodule noted in left lower lobe, with 8 mm subpleural nodule seen posteriorly in right lower lobe. Non-contrast chest CT at 6-12 months (January-August 2018) is recommended   Appears stable at this point. Cardiology is recommended ongoing observation, echocardiogram, possibly noninvasive stress testing.  DVT prophylaxis: SCDs Code Status: Full Family Communication: No family bedside  [facility]  Brendia Sacksaniel Jaeleen Inzunza, MD  [facility] Direct contact: [phone] --Via amion app OR  --[email or web address]; password [email or web address]  7PM-7AM contact night coverage as above 11/20/2015, 4:02 PM  LOS: 0 days   Consultants:  Cardiology  Procedures:  None   Antimicrobials:  None   HPI/Subjective: Feels better. Denies CP, abdominal pain, nausea, vomiting. No shortness of breath. Has not had an appetite for two weeks.   Objective: Vitals:   11/19/15 2044 11/20/15 0113 11/20/15 0510 11/20/15  1507  BP: (!) 166/99 (!) 154/81 (!) 102/50 100/69  Pulse: 92 89  75  Resp: 16 18  20   Temp: 98.6 F (37 C) 98.2 F (36.8 C) 98 F (36.7 C) 98.9 F (37.2 C)  TempSrc: Oral Oral Oral Oral  SpO2: 99% 99% 99% 97%  Weight:      Height:        Intake/Output Summary (Last 24 hours) at 11/20/15 1602 Last data filed at 11/20/15 1504  Gross per 24 hour  Intake              240 ml  Output              700 ml  Net             -460 ml     Filed Weights   11/19/15 1937  Weight: 44.2 kg (97 lb 8 oz)    Exam:    Constitutional:  . Appears calm and comfortable.Mask facies. Respiratory:  . CTA bilaterally, no w/r/r.  . Respiratory effort normal. No retractions or accessory muscle use Cardiovascular:  . RRR, no m/r/g . No LE extremity edema   . Telemetry sinus rhythm   I have personally reviewed following labs and imaging studies:  Troponins 0.14, flat   Potassium normal, calcium within normal limits. Magnesium within normal limits.  CBC unremarkable   Basic metabolic panel unremarkable  Scheduled Meds: . acidophilus  1 capsule Oral TID  . aspirin  325 mg Oral Daily  . baclofen  5 mg Oral TID  . carbidopa-levodopa  2 tablet Oral TID  . docusate sodium  100 mg Oral BID  . dorzolamide  1 drop Both Eyes TID  . DULoxetine  60 mg Oral Daily  . enoxaparin (LOVENOX) injection  30 mg Subcutaneous Q24H  . gabapentin  600 mg Oral TID  . latanoprost  1 drop Both Eyes QHS  . metoprolol tartrate  12.5 mg Oral BID  . paliperidone  12 mg Oral Daily  . QUEtiapine  50 mg Oral BID  . traMADol  50 mg Oral BID   Continuous Infusions:   Principal Problem:   Chest pain Active Problems:   Anemia, unspecified   Demand ischemia (HCC)   CAD (coronary artery disease)   Parkinson's disease (HCC)   LOS: 0 days   Time spent 20 minutes   By signing my name below, I, Cynda Acres, attest that this documentation has been prepared under the direction and in the presence of Brendia Sacks, MD. Electronically signed: Cynda Acres, Scribe 11/20/15 11:00 AM    I personally performed the services described in this documentation. All medical record entries made by the scribe were at my direction. I have reviewed the chart and agree that the record reflects my personal performance and is accurate and complete. Brendia Sacks, MD

## 2015-11-20 NOTE — Consult Note (Signed)
Cardiology Consultation Note    Patient ID: Brianna Cunningham, MRN: [id number], DOB/AGE: [birth year] 50 y.o. Admit date: 11/19/2015   Date of Consult: 11/20/2015 Primary Physician: No PCP Per Patient Primary Cardiologist: New  Chief Complaint: chest pain Reason for Consultation: chest pain, elevated troponin Requesting MD: Dr. Dartha Lodgegbata  HPI: Brianna Archererri Gronau is a 50 y.o. female with history of Parkinson's disease (uses wheelchair), anxiety, depression, former tobacco abuse, schizoaffective schizophrenia per chart, glaucoma and ?prior NSTEMI who [facility]. Per admit H/P, the patient had a NSTEMI around March of this year with no further work up as patient was deemed not a good candidate for intervention. I am not sure where this information was obtained but patient does not recall any details about the event other than that she had the same symptoms she experienced yesterday. She does not know what hospital [facility], [facility] in Care Everywhere. Yesterday she was at her rehab [name] when she developed chest pain across her whole chest while rolling around in her wheelchair. It felt like a pressure, associated with nausea. Nothing made the pain better or worse at the time. She began "shaking like a leaf" - she states her Parkinson's tremors have gotten worse over the last 2 weeks. She denies any SOB or diaphoresis. She stated it lasted for 3 hours, intermittently. (Note some details appear to be reported differently than admitting H/P). She notified the nurse at her facility who found her BP to be high and called EMS. She received 4 baby ASA and 2 SL NTG. She did not find any relief with this immediately, but pain gradually resolved prior to [facility]. In the ER she was markedly hypertensive up to 180/101 with mild sinus tach.   She received saline fluid bolus, calcium gluconate for hypocalcemia (5.8), potassium for  hypokalemia (2.7), magnesium for hypomagnesemia (1.3), IV Lopressor, and IV ativan. Troponins 0.14 x 4. F/u potassium improved. Hgb on arrival was 9.9, repeated this AM and 13.8 with delta check noted. CTA neg for PE, + pulm nodules which IM has recommended OP further f/u for. She currently denies any sx. She states this CP is what she experienced in 06/2015.  Past Medical History:  Diagnosis Date  . Anxiety   . Depression   . Former tobacco use   . Glaucoma   . NSTEMI (non-ST elevated myocardial infarction) (HCC)    a. Per admit H/P from August 2017, pt had NSTEMI 06/2015 tx medically.  . Parkinson disease (HCC)   . Psychosis   . PTSD (post-traumatic stress disorder)   . Schizo affective schizophrenia [facility](HCC)       Surgical History:  Past Surgical History:  Procedure Laterality Date  . APPENDECTOMY    . BACK SURGERY    . CESAREAN SECTION    . CHOLECYSTECTOMY       Home Meds: Prior to Admission medications   Medication Sig Start Date End Date Taking? Authorizing Provider  acidophilus (RISAQUAD) CAPS capsule Take 1 capsule by mouth 3 (three) times daily.   Yes Historical Provider, MD  baclofen (LIORESAL) 10 MG tablet Take 5 mg by mouth 3 (three) times daily.   Yes Historical Provider, MD  carbidopa-levodopa (SINEMET IR) 25-100 MG tablet Take 2 tablets by mouth 3 (three) times daily.   Yes Historical Provider, MD  docusate sodium (COLACE) 100 MG capsule Take 100 mg by mouth 2 (two) times  daily.   Yes Historical Provider, MD  dorzolamide (TRUSOPT) 2 % ophthalmic solution Place 1 drop into both eyes 3 (three) times daily.   Yes Historical Provider, MD  DULoxetine (CYMBALTA) 60 MG capsule Take 60 mg by mouth daily.   Yes Historical Provider, MD  gabapentin (NEURONTIN) 600 MG tablet Take 600 mg by mouth 3 (three) times daily.   Yes Historical Provider, MD  latanoprost (XALATAN) 0.005 % ophthalmic solution Place 1 drop into both eyes at bedtime.   Yes Historical Provider, MD  LORazepam  (ATIVAN) 0.5 MG tablet Take 0.5 mg by mouth every 6 (six) hours as needed for anxiety.   Yes Historical Provider, MD  metoprolol tartrate (LOPRESSOR) 25 MG tablet Take 12.5 mg by mouth 2 (two) times daily.   Yes Historical Provider, MD  nitroGLYCERIN (NITROSTAT) 0.4 MG SL tablet Place 0.4 mg under the tongue every 5 (five) minutes as needed for chest pain.   Yes Historical Provider, MD  paliperidone (INVEGA) 6 MG 24 hr tablet Take 12 mg by mouth daily.   Yes Historical Provider, MD  QUEtiapine (SEROQUEL) 50 MG tablet Take 50 mg by mouth 2 (two) times daily.   Yes Historical Provider, MD  traMADol (ULTRAM) 50 MG tablet Take 50 mg by mouth 2 (two) times daily.   Yes Historical Provider, MD  traMADol (ULTRAM) 50 MG tablet Take 50 mg by mouth daily as needed for moderate pain.   Yes Historical Provider, MD  Vitamin D, Ergocalciferol, (DRISDOL) 50000 units CAPS capsule Take 50,000 Units by mouth every 7 (seven) days. On Wednesday's.   Yes Historical Provider, MD    Inpatient Medications:  . acidophilus  1 capsule Oral TID  . aspirin  325 mg Oral Daily  . baclofen  5 mg Oral TID  . carbidopa-levodopa  2 tablet Oral TID  . docusate sodium  100 mg Oral BID  . dorzolamide  1 drop Both Eyes TID  . DULoxetine  60 mg Oral Daily  . enoxaparin (LOVENOX) injection  30 mg Subcutaneous Q24H  . gabapentin  600 mg Oral TID  . latanoprost  1 drop Both Eyes QHS  . metoprolol tartrate  12.5 mg Oral BID  . paliperidone  12 mg Oral Daily  . QUEtiapine  50 mg Oral BID  . traMADol  50 mg Oral BID      Allergies:  Allergies  Allergen Reactions  . Fish-Derived Products Other (See Comments)    unknown    Social History   Social History  . Marital status: Single    Spouse name: N/A  . Number of children: N/A  . Years of education: N/A   Occupational History  . Not on file.   Social History Main Topics  . Smoking status: Former Games developer  . Smokeless tobacco: Never Used     Comment: Smoked for 25 yrs   . Alcohol use No  . Drug use: No  . Sexual activity: Not on file   Other Topics Concern  . Not on file   Social History Narrative  . No narrative on file     Family History  Problem Relation Age of Onset  . Heart disease Mother     Pt unsure of details  . CAD Father     "hardening of the arteries" at 71, died at 2 of cancer  . Cancer Father      Review of Systems: All other systems reviewed and are otherwise negative except as noted above.  Labs:  Recent Labs  11/19/15 1357 11/19/15 1958 11/19/15 2252 11/20/15 0148  TROPONINI 0.14* 0.14* 0.14* 0.14*   Lab Results  Component Value Date   WBC 7.1 11/20/2015   HGB 13.8 11/20/2015   HCT 39.5 11/20/2015   MCV 86.1 11/20/2015   PLT 268 11/20/2015    Recent Labs Lab 11/19/15 1958 11/20/15 0801  NA  --  136  K  --  5.1  CL  --  108  CO2  --  19*  BUN  --  8  CREATININE  --  0.60  CALCIUM  --  9.0  PROT 8.4*  --   BILITOT 0.8  --   ALKPHOS 133*  --   ALT 11*  --   AST 14*  --   GLUCOSE  --  107*   No results found for: CHOL, HDL, LDLCALC, TRIG No results found for: DDIMER  Radiology/Studies:  Dg Chest 2 View  Result Date: 11/19/2015 CLINICAL DATA:  Chest pain. EXAM: CHEST  2 VIEW COMPARISON:  None. FINDINGS: Hyperexpansion is consistent with emphysema. The cardiopericardial silhouette is within normal limits for size. Bones are diffusely demineralized. Patient is status post extensive thoracolumbar fusion. Telemetry leads overlie the chest. IMPRESSION: Emphysema without acute cardiopulmonary process. Electronically Signed   By: Kennith Center M.D.   On: 11/19/2015 13:48   Ct Angio Chest Pe W And/or Wo Contrast  Result Date: 11/19/2015 CLINICAL DATA:  Acute chest pain. EXAM: CT ANGIOGRAPHY CHEST WITH CONTRAST TECHNIQUE: Multidetector CT imaging of the chest was performed using the standard protocol during bolus administration of intravenous contrast. Multiplanar CT image reconstructions and MIPs were  obtained to evaluate the vascular anatomy. CONTRAST:  100 mL of Isovue 370 intravenously. COMPARISON:  Radiographs of same day. FINDINGS: Status post Harrington rod fixation of thoracic spine. No pneumothorax or pleural effusion is noted. 5 mm subpleural nodule is noted in superior segment of left lower lobe best seen on image number 74 series 6. 8 mm subpleural nodule is seen posteriorly in right lower lobe best seen on image number 72 of series 6. There is no definite evidence of pulmonary embolus. There is no evidence of thoracic aortic dissection or aneurysm. No mediastinal mass or adenopathy is noted. Visualized portion of upper abdomen is unremarkable. Review of the MIP images confirms the above findings. IMPRESSION: No evidence of pulmonary embolus. 5 mm subpleural nodule noted in left lower lobe, with 8 mm subpleural nodule seen posteriorly in right lower lobe. Non-contrast chest CT at 6-12 months is recommended. If the nodule is stable at time of repeat CT, then future CT at 18-24 months (from today's scan) is considered optional for low-risk patients, but is recommended for high-risk patients. This recommendation follows the consensus statement: Guidelines for Management of Incidental Pulmonary Nodules Detected on CT Images:From the Fleischner Society 2017; published online before print (10.1148/radiol.4098119147). Electronically Signed   By: Lupita Raider, M.D.   On: 11/19/2015 15:40    Wt Readings from Last 3 Encounters:  11/19/15 97 lb 8 oz (44.2 kg)    EKG: sinus tach 104bpm, LAE, no acute ST-T changes  Physical Exam: Blood pressure (!) 102/50, pulse 89, temperature 98 F (36.7 C), temperature source Oral, resp. rate 18, height  (1.651 m), weight 97 lb 8 oz (44.2 kg), SpO2 99 %. Body mass index is 16.22 kg/m. General: Thin WF in no acute distress. Lying flat with no sx Head: Normocephalic, atraumatic, sclera non-icteric, no xanthomas, nares are without discharge.  Neck:  Negative  for carotid bruits. JVD not elevated. Lungs: Clear bilaterally to auscultation without wheezes, rales, or rhonchi. Breathing is unlabored. Heart: RRR with S1 S2. No murmurs, rubs, or gallops appreciated. Abdomen: Soft, non-tender, non-distended with normoactive bowel sounds. No hepatomegaly. No rebound/guarding. No obvious abdominal masses. Msk:  Strength and tone appear normal for age. Extremities: No clubbing or cyanosis. No edema.  Distal pedal pulses are 2+ and equal bilaterally. Neuro: Alert and oriented X 3. No facial asymmetry. Rigidity of features noted. Psych:  Responds to questions appropriately with flattened tone and affect.     Assessment and Plan   50 y.o. female with history of Parkinson's disease (uses wheelchair), anxiety, depression, former tobacco abuse, schizoaffective schizophrenia per chart, glaucoma and ?prior NSTEMI who presented to [facility] with chest pain. Found to have multiple electrolyte abnormalities, possible anemia (though now resolved?), pulm nodules for OP w/u and flattened troponin trend.  1. Chest pain with elevated troponin - per H/P, previously deemed poor candidate for invasive w/u. Would agree she has multiple comorbidities which make invasive eval less suitable. She is now pain free. Continue aspirin. Continue beta blocker. Will need to follow her BP before advancing her anti-anginal therapy now that follow-up pressure has been on the lower side. Will review need for further w/u including heparin therapy with Dr. Wyline Mood.  2. HTN on arrival - BP now on the softer side. Will need to follow.  3. Anemia - unclear why Hgb jumped from 9.9->13.8. The second one was double checked by lab. Question whether her first was a lab error. Hemoccult neg. Follow.  4. Multiple lyte abnormalities - per IM.  Signed, Laurann Montana PA-C 11/20/2015, 10:05 AM Pager: [phone]  Patient seen and discussed with PA Dunn, I agree with her documentation above. 50 yo female with PMH as  described above presents with chest pain. She reports she was wheeling her wheel chair when she had 7/10 pressure in midchest and got diaphoretic, severe diffuse body tremors. Pain lasted a few minutes. She reports a similar episode several months ago, she reports a hospital workup at the time but does not recall where. I checked Care Everywhere for [place], [place], and [facility] [place] without any records found.   K 2.7, Cr 0.36, Hgb 9.9, Plt 174, Mg 1.3 Trop 0.14 and flat EKG SR, no ischemic changes CXR chronic emphsyema changes CT PE: no PE, multiple nodules.   Somewhat of a mixed episode of chest pain associated with diffuse body tremors and multiple electrolyte abnormalities. Mild flat troponin of 0.14 is not overally specific to ischemia, EKG is benign. Though she has advanced parkinsons she is alert and oriented and mentally sharp, she is wheel chair bound but is able to wheel herself normally and can dress and feed her self with some assistance. We had an indepth discussion about the possible need for cath and the potential risks involved, and she would be open for testing if indicated. She would be a reasonable candidate for invasive testing I think if indicated. We will further risk stratify her with an echo and potentially noninvasive stress testing. In absence of recurrent symptoms and with flat troponin would not anticoagulated at this time.   Dominga Ferry MD

## 2015-11-20 NOTE — Progress Notes (Signed)
*  PRELIMINARY RESULTS* Echocardiogram 2D Echocardiogram has been performed.  Stacey DrainWhite, Tinsleigh Slovacek J 11/20/2015, 4:28 PM

## 2015-11-21 ENCOUNTER — Observation Stay (HOSPITAL_COMMUNITY): Payer: Medicaid Other

## 2015-11-21 ENCOUNTER — Encounter (HOSPITAL_COMMUNITY): Payer: Self-pay

## 2015-11-21 ENCOUNTER — Observation Stay (HOSPITAL_BASED_OUTPATIENT_CLINIC_OR_DEPARTMENT_OTHER): Payer: Medicaid Other

## 2015-11-21 DIAGNOSIS — R0789 Other chest pain: Secondary | ICD-10-CM | POA: Diagnosis not present

## 2015-11-21 DIAGNOSIS — R079 Chest pain, unspecified: Secondary | ICD-10-CM | POA: Diagnosis not present

## 2015-11-21 LAB — CBC
HCT: 36.6 % (ref 36.0–46.0)
Hemoglobin: 12.6 g/dL (ref 12.0–15.0)
MCH: 29.6 pg (ref 26.0–34.0)
MCHC: 34.4 g/dL (ref 30.0–36.0)
MCV: 86.1 fL (ref 78.0–100.0)
PLATELETS: 244 10*3/uL (ref 150–400)
RBC: 4.25 MIL/uL (ref 3.87–5.11)
RDW: 13.4 % (ref 11.5–15.5)
WBC: 4.9 10*3/uL (ref 4.0–10.5)

## 2015-11-21 LAB — LIPID PANEL
CHOL/HDL RATIO: 3 ratio
CHOLESTEROL: 166 mg/dL (ref 0–200)
HDL: 55 mg/dL (ref >40)
LDL CALC: 85 mg/dL (ref 0–99)
TRIGLYCERIDES: 129 mg/dL (ref <150)
VLDL: 26 mg/dL (ref 0–40)

## 2015-11-21 LAB — NM MYOCAR MULTI W/SPECT W/WALL MOTION / EF
CHL CUP NUCLEAR SDS: 2
CHL CUP NUCLEAR SRS: 3
CHL CUP RESTING HR STRESS: 74 {beats}/min
CSEPPHR: 99 {beats}/min
LHR: 0.29
LV dias vol: 37 mL (ref 46–106)
LVSYSVOL: 14 mL
SSS: 5
TID: 1.24

## 2015-11-21 LAB — BASIC METABOLIC PANEL
ANION GAP: 5 (ref 5–15)
BUN: 11 mg/dL (ref 6–20)
CALCIUM: 8.7 mg/dL — AB (ref 8.9–10.3)
CO2: 23 mmol/L (ref 22–32)
CREATININE: 0.66 mg/dL (ref 0.44–1.00)
Chloride: 106 mmol/L (ref 101–111)
GFR calc Af Amer: >60 mL/min (ref >60)
GLUCOSE: 103 mg/dL — AB (ref 65–99)
Potassium: 4.2 mmol/L (ref 3.5–5.1)
Sodium: 134 mmol/L — ABNORMAL LOW (ref 135–145)

## 2015-11-21 LAB — CALCIUM, IONIZED: Calcium, Ionized, Serum: 5.3 mg/dL (ref 4.5–5.6)

## 2015-11-21 LAB — VITAMIN D 25 HYDROXY (VIT D DEFICIENCY, FRACTURES): Vit D, 25-Hydroxy: 47.6 ng/mL (ref 30.0–100.0)

## 2015-11-21 MED ORDER — REGADENOSON 0.4 MG/5ML IV SOLN
INTRAVENOUS | Status: AC
  Administered 2015-11-21: 0.4 mg via INTRAVENOUS
  Filled 2015-11-21: qty 5

## 2015-11-21 MED ORDER — TECHNETIUM TC 99M TETROFOSMIN IV KIT
30.0000 | PACK | Freq: Once | INTRAVENOUS | Status: AC | PRN
  Administered 2015-11-21: 30 via INTRAVENOUS

## 2015-11-21 MED ORDER — ASPIRIN EC 81 MG PO TBEC: 81.0000 mg | DELAYED_RELEASE_TABLET | Freq: Every day | ORAL | Status: DC

## 2015-11-21 MED ORDER — TECHNETIUM TC 99M TETROFOSMIN IV KIT
10.0000 | PACK | Freq: Once | INTRAVENOUS | Status: AC | PRN
  Administered 2015-11-21: 9.3 via INTRAVENOUS

## 2015-11-21 MED ORDER — REGADENOSON 0.4 MG/5ML IV SOLN
0.4000 mg | Freq: Once | INTRAVENOUS | Status: DC | PRN
Start: 2015-11-21 — End: 2015-11-21
  Filled 2015-11-21: qty 5

## 2015-11-21 MED ORDER — SODIUM CHLORIDE 0.9% FLUSH
INTRAVENOUS | Status: AC
  Administered 2015-11-21: 10 mL via INTRAVENOUS
  Filled 2015-11-21: qty 10

## 2015-11-21 NOTE — Progress Notes (Signed)
Brianna Cunningham discharged [facility] nursing facility [name] per MD order.  Report called to receiving [name] at 1625.     Medication List    TAKE these medications   acidophilus Caps capsule Take 1 capsule by mouth 3 (three) times daily.   baclofen 10 MG tablet Commonly known as:  LIORESAL Take 5 mg by mouth 3 (three) times daily.   carbidopa-levodopa 25-100 MG tablet Commonly known as:  SINEMET IR Take 2 tablets by mouth 3 (three) times daily.   docusate sodium 100 MG capsule Commonly known as:  COLACE Take 100 mg by mouth 2 (two) times daily.   dorzolamide 2 % ophthalmic solution Commonly known as:  TRUSOPT Place 1 drop into both eyes 3 (three) times daily.   DULoxetine 60 MG capsule Commonly known as:  CYMBALTA Take 60 mg by mouth daily.   gabapentin 600 MG tablet Commonly known as:  NEURONTIN Take 600 mg by mouth 3 (three) times daily.   latanoprost 0.005 % ophthalmic solution Commonly known as:  XALATAN Place 1 drop into both eyes at bedtime.   LORazepam 0.5 MG tablet Commonly known as:  ATIVAN Take 0.5 mg by mouth every 6 (six) hours as needed for anxiety.   metoprolol tartrate 25 MG tablet Commonly known as:  LOPRESSOR Take 12.5 mg by mouth 2 (two) times daily.   nitroGLYCERIN 0.4 MG SL tablet Commonly known as:  NITROSTAT Place 0.4 mg under the tongue every 5 (five) minutes as needed for chest pain.   paliperidone 6 MG 24 hr tablet Commonly known as:  INVEGA Take 12 mg by mouth daily.   QUEtiapine 50 MG tablet Commonly known as:  SEROQUEL Take 50 mg by mouth 2 (two) times daily.   traMADol 50 MG tablet Commonly known as:  ULTRAM Take 50 mg by mouth 2 (two) times daily.   traMADol 50 MG tablet Commonly known as:  ULTRAM Take 50 mg by mouth daily as needed for moderate pain.   Vitamin D (Ergocalciferol) 50000 units Caps capsule Commonly known as:  DRISDOL Take 50,000 Units by mouth every 7 (seven) days. On Wednesday's.       Patients  skin is clean, dry and intact, no evidence of skin break down. IV site discontinued and catheter remains intact. Site without signs and symptoms of complications. Dressing and pressure applied.  Patient transported on a stretcher by non emergent EMS,  no distress noted upon discharge.  [name] 11/21/2015 4:28 PM

## 2015-11-21 NOTE — Discharge Summary (Signed)
Physician Discharge Summary  Brianna Cunningham ZOX:[id number] DOB: March 26, 1966 DOA: 11/19/2015  PCP: No PCP Per Patient  Admit date: 11/19/2015 Discharge date: 11/21/2015  Time spent: 45 minutes  Recommendations for Outpatient Follow-up:  -Will be discharged back to SNF today.   Discharge Diagnoses:  Principal Problem:   Chest pain Active Problems:   Anemia, unspecified   Demand ischemia (HCC)   CAD (coronary artery disease)   Parkinson's disease (HCC)   Discharge Condition: Stable and improved  Filed Weights   11/19/15 1937  Weight: 44.2 kg (97 lb 8 oz)    History of present illness:  As per Dr. Dartha Lodge on 26/63: 50 year old female with psychiatry history, parkinsonian features, prior NSTEMI around March of this year with no further work up as patient was deemed not a good candidate for intervention. Patient was advised to continue medical treatment of NSTEMI. Patient presents with chest pain that started around 11am today. Chest pain was described as heaviness and tightness of the chest but non radiating. Reports associated nausea, SOB and diaphoresis. Chest heaviness is said to be intermittent and would only last for few seconds. First troponin was 0.14. Potassium of 2.7 is noted, calcium of 5.8 and hemoglobin of 9.9. CTA chest is negative for PE, but revealed 5mm and 8mm subpleural nodules. EKG revealed sinus tachycardia with bilateral atrial enlargement.  Hospital Course:   Chest Pain -Resolved. -Seen by cardiology; nuclear stress test negative. No need for further cardiac testing.  Procedures:  None   Consultations:  Cardiology  Discharge Instructions  Discharge Instructions    Diet - low sodium heart healthy    Complete by:  As directed   Increase activity slowly    Complete by:  As directed       Medication List    TAKE these medications   acidophilus Caps capsule Take 1 capsule by mouth 3 (three) times daily.   baclofen 10 MG tablet Commonly known as:   LIORESAL Take 5 mg by mouth 3 (three) times daily.   carbidopa-levodopa 25-100 MG tablet Commonly known as:  SINEMET IR Take 2 tablets by mouth 3 (three) times daily.   docusate sodium 100 MG capsule Commonly known as:  COLACE Take 100 mg by mouth 2 (two) times daily.   dorzolamide 2 % ophthalmic solution Commonly known as:  TRUSOPT Place 1 drop into both eyes 3 (three) times daily.   DULoxetine 60 MG capsule Commonly known as:  CYMBALTA Take 60 mg by mouth daily.   gabapentin 600 MG tablet Commonly known as:  NEURONTIN Take 600 mg by mouth 3 (three) times daily.   latanoprost 0.005 % ophthalmic solution Commonly known as:  XALATAN Place 1 drop into both eyes at bedtime.   LORazepam 0.5 MG tablet Commonly known as:  ATIVAN Take 0.5 mg by mouth every 6 (six) hours as needed for anxiety.   metoprolol tartrate 25 MG tablet Commonly known as:  LOPRESSOR Take 12.5 mg by mouth 2 (two) times daily.   nitroGLYCERIN 0.4 MG SL tablet Commonly known as:  NITROSTAT Place 0.4 mg under the tongue every 5 (five) minutes as needed for chest pain.   paliperidone 6 MG 24 hr tablet Commonly known as:  INVEGA Take 12 mg by mouth daily.   QUEtiapine 50 MG tablet Commonly known as:  SEROQUEL Take 50 mg by mouth 2 (two) times daily.   traMADol 50 MG tablet Commonly known as:  ULTRAM Take 50 mg by mouth 2 (two) times  daily.   traMADol 50 MG tablet Commonly known as:  ULTRAM Take 50 mg by mouth daily as needed for moderate pain.   Vitamin D (Ergocalciferol) 50000 units Caps capsule Commonly known as:  DRISDOL Take 50,000 Units by mouth every 7 (seven) days. On Wednesday's.      Allergies  Allergen Reactions  . Fish-Derived Products Other (See Comments)    unknown   Follow-up Information    Joni ReiningKathryn Lawrence, NP On 12/13/2015.   Specialties:  Nurse Practitioner, Radiology, Cardiology Why:  Appointment with Cardiology Thursday August 31st at 1:50pm Contact information: [address] [place] [id number] [phone]            The results of significant diagnostics from this hospitalization (including imaging, microbiology, ancillary and laboratory) are listed below for reference.    Significant Diagnostic Studies: Dg Chest 2 View  Result Date: 11/19/2015 CLINICAL DATA:  Chest pain. EXAM: CHEST  2 VIEW COMPARISON:  None. FINDINGS: Hyperexpansion is consistent with emphysema. The cardiopericardial silhouette is within normal limits for size. Bones are diffusely demineralized. Patient is status post extensive thoracolumbar fusion. Telemetry leads overlie the chest. IMPRESSION: Emphysema without acute cardiopulmonary process. Electronically Signed   By: Kennith CenterEric  Mansell M.D.   On: 11/19/2015 13:48   Ct Angio Chest Pe W And/or Wo Contrast  Result Date: 11/19/2015 CLINICAL DATA:  Acute chest pain. EXAM: CT ANGIOGRAPHY CHEST WITH CONTRAST TECHNIQUE: Multidetector CT imaging of the chest was performed using the standard protocol during bolus administration of intravenous contrast. Multiplanar CT image reconstructions and MIPs were obtained to evaluate the vascular anatomy. CONTRAST:  100 mL of Isovue 370 intravenously. COMPARISON:  Radiographs of same day. FINDINGS: Status post Harrington rod fixation of thoracic spine. No pneumothorax or pleural effusion is noted. 5 mm subpleural nodule is noted in superior segment of left lower lobe best seen on image number 74 series 6. 8 mm subpleural nodule is seen posteriorly in right lower lobe best seen on image number 72 of series 6. There is no definite evidence of pulmonary embolus. There is no evidence of thoracic aortic dissection or aneurysm. No mediastinal mass or adenopathy is noted. Visualized portion of upper abdomen is unremarkable. Review of the MIP images confirms the above findings. IMPRESSION: No evidence of pulmonary embolus. 5 mm subpleural nodule noted in left lower lobe, with 8 mm subpleural nodule seen posteriorly in  right lower lobe. Non-contrast chest CT at 6-12 months is recommended. If the nodule is stable at time of repeat CT, then future CT at 18-24 months (from today's scan) is considered optional for low-risk patients, but is recommended for high-risk patients. This recommendation follows the consensus statement: Guidelines for Management of Incidental Pulmonary Nodules Detected on CT Images:From the Fleischner Society 2017; published online before print (10.1148/[id number]). Electronically Signed   By: Lupita RaiderJames  Green Jr, M.D.   On: 11/19/2015 15:40   Nm Myocar Multi W/spect W/wall Motion / Ef  Result Date: 11/21/2015  There was no ST segment deviation noted during stress.  The study is normal. There are no perfusion defects consistent with prior infarction or current ischemia.  This is a low risk study.  The left ventricular ejection fraction is normal (55-65%).     Microbiology: No results found for this or any previous visit (from the past 240 hour(s)).   Labs: Basic Metabolic Panel:  Recent Labs Lab 11/19/15 1357 11/19/15 1958 11/20/15 0801 11/21/15 0612  NA 140  --  136 134*  K 2.7*  --  5.1 4.2  CL 119*  --  108 106  CO2 18*  --  19* 23  GLUCOSE 82  --  107* 103*  BUN 8  --  8 11  CREATININE 0.36*  --  0.60 0.66  CALCIUM 5.8*  --  9.0 8.7*  MG 1.3* 2.0  --   --    Liver Function Tests:  Recent Labs Lab 11/19/15 1357 11/19/15 1958  AST  --  14*  ALT  --  11*  ALKPHOS  --  133*  BILITOT  --  0.8  PROT  --  8.4*  ALBUMIN 2.4* 4.3   No results for input(s): LIPASE, AMYLASE in the last 168 hours. No results for input(s): AMMONIA in the last 168 hours. CBC:  Recent Labs Lab 11/19/15 1357 11/20/15 0801 11/21/15 0612  WBC 6.3 7.1 4.9  HGB 9.9* 13.8 12.6  HCT 28.6* 39.5 36.6  MCV 87.2 86.1 86.1  PLT 174 268 244   Cardiac Enzymes:  Recent Labs Lab 11/19/15 1357 11/19/15 1958 11/19/15 2252 11/20/15 0148  TROPONINI 0.14* 0.14* 0.14* 0.14*   BNP: BNP  (last 3 results) No results for input(s): BNP in the last 8760 hours.  ProBNP (last 3 results) No results for input(s): PROBNP in the last 8760 hours.  CBG: No results for input(s): GLUCAP in the last 168 hours.     SignedChaya Jan  [facility] Pager: [phone] 11/21/2015, 3:30 PM

## 2015-11-21 NOTE — [place] FL2 (Signed)
[place] MEDICAID FL2 LEVEL OF CARE SCREENING TOOL     IDENTIFICATION  Patient Name: Brianna Cunningham Birthdate: May 16, 1965 Sex: female Admission Date (Current Location): 11/19/2015  [place] and [place] Number:  [place] [id number] [facility] Facility and Address:  [facility],  [address], [place] [address]      Provider Number: [id number]  Attending Physician Name and Address:  Micael Hampshire Acost*  Relative Name and Phone Number:       Current Level of Care: Hospital Recommended Level of Care: Nursing Facility Prior Approval Number:    Date Approved/Denied:   PASRR Number: [id number]  Discharge Plan: SNF    Current Diagnoses: Patient Active Problem List   Diagnosis Date Noted  . Anemia, unspecified 11/20/2015  . Demand ischemia (HCC) 11/20/2015  . CAD (coronary artery disease) 11/20/2015  . Parkinson's disease (HCC) 11/20/2015  . Chest pain 11/19/2015    Orientation RESPIRATION BLADDER Height & Weight     Self, Situation, Place  Normal Incontinent Weight: 97 lb 8 oz (44.2 kg) (97.5kg is incorrect. entered the wrong unit) Height:   (165.1 cm)  BEHAVIORAL SYMPTOMS/MOOD NEUROLOGICAL BOWEL NUTRITION STATUS  Other (Comment) (n/a)  (n/a) Incontinent Diet (Regular)  AMBULATORY STATUS COMMUNICATION OF NEEDS Skin   Extensive Assist Verbally Normal                       Personal Care Assistance Level of Assistance  Bathing, Dressing, Feeding Bathing Assistance: Maximum assistance Feeding assistance: Limited assistance       Functional Limitations Info  Sight, Hearing, Speech Sight Info: Adequate Hearing Info: Adequate Speech Info: Adequate    SPECIAL CARE FACTORS FREQUENCY                       Contractures      Additional Factors Info  Psychotropic Code Status Info: Full code Allergies Info: Fish-derived products Psychotropic Info: Seroquel, Ativan         Current Medications (11/21/2015):  This is the current hospital  active medication list Current Facility-Administered Medications  Medication Dose Route Frequency Provider Last Rate Last Dose  . acetaminophen (TYLENOL) tablet 650 mg  650 mg Oral Q4H PRN Barnetta Chapel, MD      . acidophilus (RISAQUAD) capsule 1 capsule  1 capsule Oral TID Barnetta Chapel, MD   1 capsule at 11/21/15 0950  . aspirin tablet 325 mg  325 mg Oral Daily Barnetta Chapel, MD   325 mg at 11/21/15 0950  . baclofen (LIORESAL) tablet 5 mg  5 mg Oral TID Barnetta Chapel, MD   5 mg at 11/21/15 0951  . carbidopa-levodopa (SINEMET IR) 25-100 MG per tablet immediate release 2 tablet  2 tablet Oral TID Barnetta Chapel, MD   2 tablet at 11/21/15 0950  . docusate sodium (COLACE) capsule 100 mg  100 mg Oral BID Barnetta Chapel, MD   100 mg at 11/21/15 0950  . dorzolamide (TRUSOPT) 2 % ophthalmic solution 1 drop  1 drop Both Eyes TID Barnetta Chapel, MD   1 drop at 11/21/15 0953  . DULoxetine (CYMBALTA) DR capsule 60 mg  60 mg Oral Daily Barnetta Chapel, MD   60 mg at 11/20/15 0819  . enoxaparin (LOVENOX) injection 30 mg  30 mg Subcutaneous Q24H Standley Brooking, MD   30 mg at 11/20/15 2124  . gabapentin (NEURONTIN) capsule 600 mg  600 mg Oral TID Barnetta Chapel,  MD   600 mg at 11/21/15 0950  . latanoprost (XALATAN) 0.005 % ophthalmic solution 1 drop  1 drop Both Eyes QHS Barnetta ChapelSylvester I Ogbata, MD   1 drop at 11/20/15 2120  . LORazepam (ATIVAN) tablet 0.5 mg  0.5 mg Oral Q4H PRN Standley Brookinganiel P Goodrich, MD   0.5 mg at 11/21/15 0950  . metoprolol tartrate (LOPRESSOR) tablet 12.5 mg  12.5 mg Oral BID Barnetta ChapelSylvester I Ogbata, MD   12.5 mg at 11/21/15 0951  . nitroGLYCERIN (NITROSTAT) SL tablet 0.4 mg  0.4 mg Sublingual Q5 min PRN Barnetta ChapelSylvester I Ogbata, MD      . ondansetron [facility](ZOFRAN) injection 4 mg  4 mg Intravenous Q6H PRN Barnetta ChapelSylvester I Ogbata, MD      . paliperidone (INVEGA) 24 hr tablet 12 mg  12 mg Oral Daily Barnetta ChapelSylvester I Ogbata, MD   12 mg at 11/21/15 0949  . potassium chloride SA  (K-DUR,KLOR-CON) CR tablet 20 mEq  20 mEq Oral Q4H PRN Jacalyn LefevreJulie Haviland, MD   20 mEq at 11/19/15 1628  . QUEtiapine (SEROQUEL) tablet 50 mg  50 mg Oral BID Barnetta ChapelSylvester I Ogbata, MD   50 mg at 11/21/15 0950  . technetium tetrofosmin (TC-MYOVIEW) injection 30 millicurie  30 millicurie Intravenous Once PRN [employer] Register Jr., MD      . traMADol Janean Sark(ULTRAM) tablet 50 mg  50 mg Oral BID Barnetta ChapelSylvester I Ogbata, MD   50 mg at 11/21/15 0950  . traMADol (ULTRAM) tablet 50 mg  50 mg Oral Daily PRN Barnetta ChapelSylvester I Ogbata, MD         -Discharge Medications: Please see discharge summary for a list of discharge medications.  Relevant Imaging Results:  Relevant Lab Results:   Additional Information [place] expires 01/22/16  Karn CassisStultz, Neyda [place], [place] [phone]

## 2015-11-21 NOTE — Progress Notes (Addendum)
Subjective:  Anxious about stress test  Objective:  Vital Signs in the last 24 hours: Temp:  [98.4 F (36.9 C)-98.9 F (37.2 C)] 98.6 F (37 C) (08/09 0500) Pulse Rate:  [75-111] 89 (08/09 0500) Resp:  [20] 20 (08/09 0500) BP: (100-160)/(69-104) 124/72 (08/09 0500) SpO2:  [95 %-97 %] 96 % (08/09 0500)  Intake/Output from previous day: 08/08 0701 - 08/09 0700 In: 480 [P.O.:480] Out: 600 [Urine:600] Intake/Output from this shift: Total I/O In: -  Out: 400 [Urine:400]  Physical Exam: NECK: Without JVD, HJR, or bruit LUNGS: Clear anterior, posterior, lateral HEART: Regular rate and rhythm, no murmur, gallop, rub, bruit, thrill, or heave EXTREMITIES: Without cyanosis, clubbing, or edema   Lab Results:  Recent Labs  11/20/15 0801 11/21/15 0612  WBC 7.1 4.9  HGB 13.8 12.6  PLT 268 244    Recent Labs  11/20/15 0801 11/21/15 0612  NA 136 134*  K 5.1 4.2  CL 108 106  CO2 19* 23  GLUCOSE 107* 103*  BUN 8 11  CREATININE 0.60 0.66    Recent Labs  11/19/15 2252 11/20/15 0148  TROPONINI 0.14* 0.14*   Hepatic Function Panel  Recent Labs  11/19/15 1958  PROT 8.4*  ALBUMIN 4.3  AST 14*  ALT 11*  ALKPHOS 133*  BILITOT 0.8  BILIDIR 0.1  IBILI 0.7    Recent Labs  11/21/15 0612  CHOL 166   No results for input(s): PROTIME in the last 72 hours.    Cardiac Studies: Study Conclusions   - Left ventricle: The cavity size was normal. Wall thickness was   normal. Systolic function was normal. The estimated ejection   fraction was in the range of 60% to 65%. Diastolic function is   abnormal, indeterminate grade. - Aortic valve: Valve area (VTI): 1.69 cm^2. Valve area (Vmax):   1.61 cm^2. Valve area (Vmean): 1.59 cm^2. - Atrial septum: No defect or patent foramen ovale was identified. - Technically difficult study.   -------------------------------------------------------------------  Assessment/Plan:  1. Chest pain with elevated troponin. . She is  now pain free. Continue aspirin. Continue beta blocker. Echo with normal systolic function diastolic dysfunction. Dr. Wyline MoodBranch ordered lexiscan myoview to be done today.  2. HTN on arrival - BP now on the softer side. Will need to follow.  3. Anemia - unclear why Hgb jumped from 9.9->13.8. The second one was double checked by lab. Question whether her first was a lab error. Hemoccult neg. Follow.  4. Multiple lyte abnormalities - per IM.  5. Parkinson's disease  6 Anxiety/Depression    LOS: 0 days    Jacolyn ReedyMichele Lenze 11/21/2015, 10:06 AM   Attending note Patient seen and discussed with PA Geni BersLenze, I agree with her documentation. Overall negative workup with normal echo and nuclear stress test. Mild flat troponin elevation in setting of atypical symptoms and negative follow up cardiac testing. No plans for further testing, she may f/u with us 3 weeks after discharge.     Dominga FerryJ Brogan Martis MD

## 2015-11-21 NOTE — Clinical Social Work Note (Signed)
Clinical Social Work Assessment  Patient Details  Name: Brianna Cunningham MRN: [id number] Date of Birth: 11/15/1965  Date of referral:  11/21/15               Reason for consult:  Discharge Planning                Permission sought to share information with:  Chartered certified accountant granted to share information::  Yes, Verbal Permission Granted  Name::        Agency::  [facility]  Relationship::  Designer, industrial/product Information:     Housing/Transportation Living arrangements for the past 2 months:  [place] Information:  Patient, Facility Patient Interpreter Needed:  None Criminal Activity/Legal Involvement Pertinent to Current Situation/Hospitalization:  No - Comment as needed Significant Relationships:  Adult Children Lives with:  Facility Resident Do you feel safe going back to the place where you live?  Yes Need for family participation in patient care:  No (Coment)  Care giving concerns:  None reported. Pt is long term resident at [facility].    Social Worker assessment / plan:  CSW met with pt at bedside. Pt reports she has been a resident at [facility] for several months. She shared that she has three sons who live in [place]. They work a lot and talk to pt on phone regularly. Pt reports that she was at a nursing facility prior to [facility] but did not like it there. She said that placement is long term due to Parkinson's. Per Lattie Haw at facility, pt requires assistance with all ADLs and uses wheelchair. Okay to return.   Employment status:  Disabled (Comment on whether or not currently receiving Disability) Insurance information:  Medicaid [place] PT Recommendations:  Not assessed at this time Information / Referral to community resources:  Other (Comment Required) (Return to SNF)  Patient/Family's Response to care:  Pt agreeable to return to [facility] when medically stable.    Patient/Family's Understanding of and Emotional  Response to Diagnosis, Current Treatment, and Prognosis:  Pt is very anxious about medical situation. Discussed with RN.   Emotional Assessment Appearance:  Appears older than stated age Attitude/Demeanor/Rapport:  Other (Anxious) Affect (typically observed):  Anxious Orientation:  Oriented to Self, Oriented to Place, Oriented to Situation Alcohol / Substance use:  Not Applicable Psych involvement (Current and /or in the community):  No (Comment)  Discharge Needs  Concerns to be addressed:  No discharge needs identified Readmission within the last 30 days:  No Current discharge risk:  Psychiatric Illness, Physical Impairment Barriers to Discharge:  No Barriers Identified   Salome Arnt, [place] 11/21/2015, 10:13 AM [phone]

## 2015-11-22 LAB — PARATHYROID HORMONE, INTACT (NO CA): PTH: 22 pg/mL (ref 15–65)

## 2015-12-13 ENCOUNTER — Encounter: Payer: Medicaid Other | Admitting: Adult Health

## 2015-12-13 ENCOUNTER — Encounter: Payer: Self-pay | Admitting: Adult Health

## 2015-12-13 NOTE — Progress Notes (Deleted)
Cardiology Office Note   Date:  12/13/2015   ID:  Brianna Cunningham, DOB 04/07/1966, MRN [id number]  PCP:  No PCP Per Patient  Cardiologist:  Arlington Calix, NP   No chief complaint on file.     History of Present Illness: Brianna Cunningham is a 50 y.o. female who presents for post hospital follow up after admission for chest pain with known history of Parkinson's disease, anxiety and depression, schizophrenia, anemia. She was found to have NSTEMI.   There was no ST segment deviation noted during stress.  The study is normal. There are no perfusion defects consistent with prior infarction or current ischemia.  This is a low risk study.  The left ventricular ejection fraction is normal (55-65%).   Echocardiogram Left ventricle: The cavity size was normal. Wall thickness was   normal. Systolic function was normal. The estimated ejection   fraction was in the range of 60% to 65%. Diastolic function is   abnormal, indeterminate grade. - Aortic valve: Valve area (VTI): 1.69 cm^2. Valve area (Vmax):   1.61 cm^2. Valve area (Vmean): 1.59 cm^2. - Atrial septum: No defect or patent foramen ovale was identified. - Technically difficult study.  No further testing or cardiology invasive procedures.   Past Medical History:  Diagnosis Date  . Anxiety   . Asthma   . Depression   . Former tobacco use   . Glaucoma   . Hypertension   . NSTEMI (non-ST elevated myocardial infarction) (HCC)    a. Per admit H/P from August 2017, pt had NSTEMI 06/2015 tx medically.  . Parkinson disease (HCC)   . Psychosis   . PTSD (post-traumatic stress disorder)   . Schizo affective schizophrenia [facility])     Past Surgical History:  Procedure Laterality Date  . APPENDECTOMY    . BACK SURGERY    . CESAREAN SECTION    . CHOLECYSTECTOMY       Current Outpatient Prescriptions  Medication Sig Dispense Refill  . acidophilus (RISAQUAD) CAPS capsule Take 1 capsule by mouth 3 (three) times daily.    .  baclofen (LIORESAL) 10 MG tablet Take 5 mg by mouth 3 (three) times daily.    . carbidopa-levodopa (SINEMET IR) 25-100 MG tablet Take 2 tablets by mouth 3 (three) times daily.    Marland Kitchen docusate sodium (COLACE) 100 MG capsule Take 100 mg by mouth 2 (two) times daily.    . dorzolamide (TRUSOPT) 2 % ophthalmic solution Place 1 drop into both eyes 3 (three) times daily.    . DULoxetine (CYMBALTA) 60 MG capsule Take 60 mg by mouth daily.    Marland Kitchen gabapentin (NEURONTIN) 600 MG tablet Take 600 mg by mouth 3 (three) times daily.    Marland Kitchen latanoprost (XALATAN) 0.005 % ophthalmic solution Place 1 drop into both eyes at bedtime.    Marland Kitchen LORazepam (ATIVAN) 0.5 MG tablet Take 0.5 mg by mouth every 6 (six) hours as needed for anxiety.    . metoprolol tartrate (LOPRESSOR) 25 MG tablet Take 12.5 mg by mouth 2 (two) times daily.    . nitroGLYCERIN (NITROSTAT) 0.4 MG SL tablet Place 0.4 mg under the tongue every 5 (five) minutes as needed for chest pain.    . paliperidone (INVEGA) 6 MG 24 hr tablet Take 12 mg by mouth daily.    . QUEtiapine (SEROQUEL) 50 MG tablet Take 50 mg by mouth 2 (two) times daily.    . traMADol (ULTRAM) 50 MG tablet Take 50 mg by mouth 2 (two) times daily.    Marland Kitchen  traMADol (ULTRAM) 50 MG tablet Take 50 mg by mouth daily as needed for moderate pain.    . Vitamin D, Ergocalciferol, (DRISDOL) 50000 units CAPS capsule Take 50,000 Units by mouth every 7 (seven) days. On Wednesday's.     No current facility-administered medications for this visit.     Allergies:   Fish-derived products    Social History:  The patient  reports that she has quit smoking. She has never used smokeless tobacco. She reports that she does not drink alcohol or use drugs.   Family History:  The patient's family history includes CAD in her father; Cancer in her father; Heart disease in her mother.    ROS: All other systems are reviewed and negative. Unless otherwise mentioned in H&P    PHYSICAL EXAM: VS:  LMP  (LMP Unknown)  Comment: post menopausal , BMI There is no height or weight on file to calculate BMI. GEN: Well nourished, well developed, in no acute distress  HEENT: normal  Neck: no JVD, carotid bruits, or masses Cardiac: ***RRR; no murmurs, rubs, or gallops,no edema  Respiratory:  clear to auscultation bilaterally, normal work of breathing GI: soft, nontender, nondistended, + BS MS: no deformity or atrophy  Skin: warm and dry, no rash Neuro:  Strength and sensation are intact Psych: euthymic mood, full affect   EKG:  EKG {ACTION; IS/IS [id number]}OT:[id number]} ordered today. The ekg ordered today demonstrates ***   Recent Labs: 11/19/2015: ALT 11; Magnesium 2.0 11/21/2015: BUN 11; Creatinine, Ser 0.66; Hemoglobin 12.6; Platelets 244; Potassium 4.2; Sodium 134    Lipid Panel    Component Value Date/Time   CHOL 166 11/21/2015 0612   TRIG 129 11/21/2015 0612   HDL 55 11/21/2015 0612   CHOLHDL 3.0 11/21/2015 0612   VLDL 26 11/21/2015 0612   LDLCALC 85 11/21/2015 0612      Wt Readings from Last 3 Encounters:  11/19/15 97 lb 8 oz (44.2 kg)      Other studies Reviewed: Additional studies/ records that were reviewed today include: ***. Review of the above records demonstrates: ***   ASSESSMENT AND PLAN:  1.  ***   Current medicines are reviewed at length with the patient today.    Labs/ tests ordered today include: *** No orders of the defined types were placed in this encounter.    Disposition:   FU with *** in {gen number [id number]:[id number]} {TIME; UNITS DAY/WEEK/MONTH:19136}   Signed, Joni ReiningKathryn Lawrence, NP  12/13/2015 7:32 AM    [facility] [address], [place], [place] [address] Phone: [phone]; Fax: [phone]

## 2016-01-03 NOTE — Progress Notes (Signed)
Cardiology Office Note   Date:  01/04/2016   ID:  Brianna Cunningham, DOB [birth year], MRN [id number]  PCP:  No PCP Per Patient  Cardiologist:  Dr. Wyline Mood    Chief Complaint  Patient presents with  . Hospitalization Follow-up    no further chest pain      History of Present Illness: Brianna Cunningham is a 50 y.o. female who presents for post hospitalization.    She has a  history of Parkinson's disease (uses wheelchair), anxiety, depression, former tobacco abuse, schizoaffective schizophrenia per chart, glaucoma and ?prior NSTEMI who presented to [facility] with chest pain. Per admit H/P, the patient had a NSTEMI around March of this year with no further work up as patient was deemed not a good candidate for intervention.   Pt admitted with HTN 181/101 and chest pain.  +troponin 0.14 and flat EKG without ischemic changes. Echo was stable with normal systolic function diastolic dysfunction. lexiscan myoview without ischemia and EF normal at 55-65%.   Since d/c  No chest pain, overall she has done well until today.  Her BP is 70 systolic.  She has not had cold or fever, she has been eating well.  She is tired today was somewhat tired yesterday, very sleepy.  No pain or SOB.   Past Medical History:  Diagnosis Date  . Anxiety   . Asthma   . Depression   . Former tobacco use   . Glaucoma   . Hypertension   . NSTEMI (non-ST elevated myocardial infarction) (HCC)    a. Per admit H/P from August 2017, pt had NSTEMI 06/2015 tx medically.  . Parkinson disease (HCC)   . Psychosis   . PTSD (post-traumatic stress disorder)   . Schizo affective schizophrenia [facility])     Past Surgical History:  Procedure Laterality Date  . APPENDECTOMY    . BACK SURGERY    . CESAREAN SECTION    . CHOLECYSTECTOMY       Current Outpatient Prescriptions  Medication Sig Dispense Refill  . acidophilus (RISAQUAD) CAPS capsule Take 1 capsule by mouth 3 (three) times daily.    Marland Kitchen aspirin EC 81 MG tablet Take 81 mg by mouth daily.     . baclofen (LIORESAL) 10 MG tablet Take 5 mg by mouth 3 (three) times daily.    . carbidopa-levodopa (SINEMET IR) 25-100 MG tablet Take 2 tablets by mouth 3 (three) times daily.    Marland Kitchen docusate sodium (COLACE) 100 MG capsule Take 100 mg by mouth 2 (two) times daily.    . dorzolamide (TRUSOPT) 2 % ophthalmic solution Place 1 drop into both eyes 3 (three) times daily.    . DULoxetine (CYMBALTA) 60 MG capsule Take 60 mg by mouth daily.    Marland Kitchen gabapentin (NEURONTIN) 600 MG tablet Take 600 mg by mouth 3 (three) times daily.    Marland Kitchen latanoprost (XALATAN) 0.005 % ophthalmic solution Place 1 drop into both eyes at bedtime.    Marland Kitchen LORazepam (ATIVAN) 0.5 MG tablet Take 0.5 mg by mouth every 6 (six) hours as needed for anxiety.    . metoprolol tartrate (LOPRESSOR) 25 MG tablet Take 12.5 mg by mouth 2 (two) times daily.    . nitroGLYCERIN (NITROSTAT) 0.4 MG SL tablet Place 0.4 mg under the tongue every 5 (five) minutes as needed for chest pain.    . paliperidone (INVEGA) 6 MG 24 hr tablet Take 12 mg by mouth daily.    . QUEtiapine (SEROQUEL) 50 MG tablet Take 50 mg by  mouth 2 (two) times daily.    . traMADol (ULTRAM) 50 MG tablet Take 50 mg by mouth daily as needed for moderate pain.    . Vitamin D, Ergocalciferol, (DRISDOL) 50000 units CAPS capsule Take 50,000 Units by mouth every 7 (seven) days. On Wednesday's.     No current facility-administered medications for this visit.     Allergies:   Fish-derived products    Social History:  The patient  reports that she has quit smoking. She has never used smokeless tobacco. She reports that she does not drink alcohol or use drugs.   Family History:  The patient's family history includes CAD in her father; Cancer in her father; Heart disease in her mother.    ROS:  General:no colds or fevers, no weight changes Skin:no rashes or ulcers HEENT:no blurred vision, no congestion CV:see HPI PUL:see HPI GI:no diarrhea constipation or melena, no indigestion GU:no  hematuria, no dysuria MS:no joint pain, no claudication Neuro:no syncope, no lightheadedness Endo:no diabetes, no thyroid disease  Wt Readings from Last 3 Encounters:  01/04/16 105 lb (47.6 kg)  11/19/15 97 lb 8 oz (44.2 kg)     PHYSICAL EXAM: VS:  BP (!) 70/40   Pulse 83   Ht 5\' 5"  (1.651 m)   Wt 105 lb (47.6 kg)   LMP  (LMP Unknown) Comment: post menopausal  SpO2 94%   BMI 17.47 kg/m  , BMI Body mass index is 17.47 kg/m. General:Pleasant affect, NAD Skin:Warm and dry, brisk capillary refill HEENT:normocephalic, sclera clear, mucus membranes moist Neck:supple, no JVD, no bruits  Heart:S1S2 RRR without murmur, gallup, rub or click Lungs:clear without rales, rhonchi, or wheezes WJX:BJYN, non tender, + BS, do not palpate liver spleen or masses Ext:no lower ext edema, 2+ pedal pulses, 2+ radial pulses Neuro:alert and oriented, MAE, follows commands, + facial symmetry    EKG:  EKG is NOT ordered today.     Recent Labs: 11/19/2015: ALT 11; Magnesium 2.0 11/21/2015: BUN 11; Creatinine, Ser 0.66; Hemoglobin 12.6; Platelets 244; Potassium 4.2; Sodium 134    Lipid Panel    Component Value Date/Time   CHOL 166 11/21/2015 0612   TRIG 129 11/21/2015 0612   HDL 55 11/21/2015 0612   CHOLHDL 3.0 11/21/2015 0612   VLDL 26 11/21/2015 0612   LDLCALC 85 11/21/2015 0612       Other studies Reviewed: Additional studies/ records that were reviewed today include:  Echo  - Left ventricle: The cavity size was normal. Wall thickness was   normal. Systolic function was normal. The estimated ejection   fraction was in the range of 60% to 65%. Diastolic function is   abnormal, indeterminate grade. - Aortic valve: Valve area (VTI): 1.69 cm^2. Valve area (Vmax):   1.61 cm^2. Valve area (Vmean): 1.59 cm^2. - Atrial septum: No defect or patent foramen ovale was identified. - Technically difficult study  Nuc: Study Result    There was no ST segment deviation noted during  stress.  The study is normal. There are no perfusion defects consistent with prior infarction or current ischemia.  This is a low risk study.  The left ventricular ejection fraction is normal (55-65%).     ASSESSMENT AND PLAN:  1. Hypotension, with both automatic and manual cuff.  She denies fevers and has been eating and drinking well. Dr. Wyline Mood has seen her as well, will send to ER for further eval.   2. Chest pain with elevated troponin previous admit with. No further episodes.Continue aspirin. Continue  beta blocker depending on ER visit. Echo with normal systolic function diastolic dysfunction. lexiscan myoview was normal.  3. HTN now low, is routinely on lopressor and was hypertensive on last visit.  4. Multiple lyte abnormalities - on last hospitalization  5. Parkinson's disease- today no tremor  6 Anxiety/Depression followed by PCP  Current medicines are reviewed with the patient today.  The patient Has no concerns regarding medicines.  The following changes have been made:  See above Labs/ tests ordered today include:see above  Disposition:   FU:  see above  Signed, Nada BoozerLaura Bharath Bernstein, NP  01/04/2016 1:58 PM    [facility] [address], [place], [place]  [address]/ [address] [place], [place] Phone: [phone]; Fax: [phone]  [phone]

## 2016-01-04 ENCOUNTER — Emergency Department (HOSPITAL_COMMUNITY): Payer: Medicaid Other

## 2016-01-04 ENCOUNTER — Encounter: Payer: Self-pay | Admitting: Cardiology

## 2016-01-04 ENCOUNTER — Emergency Department (HOSPITAL_COMMUNITY)
Admission: EM | Admit: 2016-01-04 | Discharge: 2016-01-04 | Disposition: A | Discharge status: Home / Self Care | Payer: Medicaid Other | Attending: Emergency Medicine | Admitting: Emergency Medicine

## 2016-01-04 ENCOUNTER — Ambulatory Visit (INDEPENDENT_AMBULATORY_CARE_PROVIDER_SITE_OTHER): Payer: Medicaid Other | Admitting: Cardiology

## 2016-01-04 ENCOUNTER — Encounter (HOSPITAL_COMMUNITY): Payer: Self-pay | Admitting: *Deleted

## 2016-01-04 VITALS — BP 70/40 | HR 83 | Ht 65.0 in | Wt 105.0 lb

## 2016-01-04 DIAGNOSIS — Z79899 Other long term (current) drug therapy: Secondary | ICD-10-CM | POA: Insufficient documentation

## 2016-01-04 DIAGNOSIS — E860 Dehydration: Secondary | ICD-10-CM | POA: Insufficient documentation

## 2016-01-04 DIAGNOSIS — Z87891 Personal history of nicotine dependence: Secondary | ICD-10-CM | POA: Insufficient documentation

## 2016-01-04 DIAGNOSIS — I10 Essential (primary) hypertension: Secondary | ICD-10-CM

## 2016-01-04 DIAGNOSIS — Z7982 Long term (current) use of aspirin: Secondary | ICD-10-CM | POA: Diagnosis not present

## 2016-01-04 DIAGNOSIS — J45909 Unspecified asthma, uncomplicated: Secondary | ICD-10-CM | POA: Insufficient documentation

## 2016-01-04 DIAGNOSIS — R079 Chest pain, unspecified: Secondary | ICD-10-CM

## 2016-01-04 DIAGNOSIS — I959 Hypotension, unspecified: Secondary | ICD-10-CM | POA: Diagnosis not present

## 2016-01-04 DIAGNOSIS — I2510 Atherosclerotic heart disease of native coronary artery without angina pectoris: Secondary | ICD-10-CM | POA: Diagnosis not present

## 2016-01-04 DIAGNOSIS — R531 Weakness: Secondary | ICD-10-CM | POA: Diagnosis present

## 2016-01-04 LAB — COMPREHENSIVE METABOLIC PANEL
ALK PHOS: 120 U/L (ref 38–126)
ALT: <5 U/L — ABNORMAL LOW (ref 14–54)
ANION GAP: 7 (ref 5–15)
AST: 13 U/L — ABNORMAL LOW (ref 15–41)
Albumin: 3.8 g/dL (ref 3.5–5.0)
BILIRUBIN TOTAL: 0.5 mg/dL (ref 0.3–1.2)
BUN: 14 mg/dL (ref 6–20)
CALCIUM: 8.8 mg/dL — AB (ref 8.9–10.3)
CO2: 26 mmol/L (ref 22–32)
Chloride: 104 mmol/L (ref 101–111)
Creatinine, Ser: 0.65 mg/dL (ref 0.44–1.00)
Glucose, Bld: 98 mg/dL (ref 65–99)
Potassium: 3.8 mmol/L (ref 3.5–5.1)
SODIUM: 137 mmol/L (ref 135–145)
TOTAL PROTEIN: 7.1 g/dL (ref 6.5–8.1)

## 2016-01-04 LAB — I-STAT CHEM 8, ED
BUN: 13 mg/dL (ref 6–20)
Calcium, Ion: 1.19 mmol/L (ref 1.15–1.40)
Chloride: 103 mmol/L (ref 101–111)
Creatinine, Ser: 0.8 mg/dL (ref 0.44–1.00)
Glucose, Bld: 97 mg/dL (ref 65–99)
HEMATOCRIT: 40 % (ref 36.0–46.0)
HEMOGLOBIN: 13.6 g/dL (ref 12.0–15.0)
POTASSIUM: 4 mmol/L (ref 3.5–5.1)
SODIUM: 140 mmol/L (ref 135–145)
TCO2: 25 mmol/L (ref 0–100)

## 2016-01-04 LAB — CBC WITH DIFFERENTIAL/PLATELET
Basophils Absolute: 0 10*3/uL (ref 0.0–0.1)
Basophils Relative: 0 %
EOS ABS: 0.2 10*3/uL (ref 0.0–0.7)
Eosinophils Relative: 2 %
HCT: 36.7 % (ref 36.0–46.0)
HEMOGLOBIN: 12.5 g/dL (ref 12.0–15.0)
LYMPHS ABS: 3.2 10*3/uL (ref 0.7–4.0)
Lymphocytes Relative: 43 %
MCH: 30.2 pg (ref 26.0–34.0)
MCHC: 34.1 g/dL (ref 30.0–36.0)
MCV: 88.6 fL (ref 78.0–100.0)
MONOS PCT: 6 %
Monocytes Absolute: 0.4 10*3/uL (ref 0.1–1.0)
NEUTROS PCT: 49 %
Neutro Abs: 3.7 10*3/uL (ref 1.7–7.7)
Platelets: 274 10*3/uL (ref 150–400)
RBC: 4.14 MIL/uL (ref 3.87–5.11)
RDW: 13.4 % (ref 11.5–15.5)
WBC: 7.6 10*3/uL (ref 4.0–10.5)

## 2016-01-04 LAB — URINALYSIS, ROUTINE W REFLEX MICROSCOPIC
BILIRUBIN URINE: NEGATIVE
Glucose, UA: NEGATIVE mg/dL
Hgb urine dipstick: NEGATIVE
LEUKOCYTES UA: NEGATIVE
NITRITE: NEGATIVE
PROTEIN: NEGATIVE mg/dL
Specific Gravity, Urine: 1.025 (ref 1.005–1.030)
pH: 6 (ref 5.0–8.0)

## 2016-01-04 LAB — I-STAT CG4 LACTIC ACID, ED: LACTIC ACID, VENOUS: 0.66 mmol/L (ref 0.5–1.9)

## 2016-01-04 MED ORDER — SODIUM CHLORIDE 0.9 % IV BOLUS (SEPSIS): 500.0000 mL | Freq: Once | INTRAVENOUS | Status: DC

## 2016-01-04 MED ORDER — LORAZEPAM 0.5 MG PO TABS
0.5000 mg | ORAL_TABLET | Freq: Once | ORAL | Status: AC
  Administered 2016-01-04: 0.5 mg via ORAL
  Filled 2016-01-04: qty 1

## 2016-01-04 MED ORDER — SODIUM CHLORIDE 0.9 % IV BOLUS (SEPSIS)
1000.0000 mL | Freq: Once | INTRAVENOUS | Status: AC
  Administered 2016-01-04: 1000 mL via INTRAVENOUS

## 2016-01-04 NOTE — ED Notes (Signed)
[place] [facility]. Will need transportation to return to facililty

## 2016-01-04 NOTE — ED Provider Notes (Signed)
[facility]-EMERGENCY DEPT Provider Note   CSN: [id number] Arrival date & time: 01/04/16  1352     History   Chief Complaint Chief Complaint  Patient presents with  . Hypotension    HPI Brianna Cunningham is a 50 y.o. female.  Patient complains of weakness. She was found to have low blood pressure by the paramedics. No fever chills cough no pain   The history is provided by the patient. No language interpreter was used.  Weakness  Primary symptoms comment: General weakness. This is a new problem. The current episode started 12 to 24 hours ago. The problem has not changed since onset.There was no focality noted. There has been no fever. Pertinent negatives include no shortness of breath, no chest pain and no headaches. There were no medications administered prior to arrival. Associated medical issues do not include trauma.    Past Medical History:  Diagnosis Date  . Anxiety   . Asthma   . Depression   . Former tobacco use   . Glaucoma   . Hypertension   . NSTEMI (non-ST elevated myocardial infarction) (HCC)    a. Per admit H/P from August 2017, pt had NSTEMI 06/2015 tx medically.  . Parkinson disease (HCC)   . Psychosis   . PTSD (post-traumatic stress disorder)   . Schizo affective schizophrenia [facility])     Patient Active Problem List   Diagnosis Date Noted  . Anemia, unspecified 11/20/2015  . Demand ischemia (HCC) 11/20/2015  . CAD (coronary artery disease) 11/20/2015  . Parkinson's disease (HCC) 11/20/2015  . Chest pain 11/19/2015    Past Surgical History:  Procedure Laterality Date  . APPENDECTOMY    . BACK SURGERY    . CESAREAN SECTION    . CHOLECYSTECTOMY      OB History    No data available       Home Medications    Prior to Admission medications   Medication Sig Start Date End Date Taking? Authorizing Provider  acetaminophen (TYLENOL) 325 MG tablet Take 650 mg by mouth every 4 (four) hours as needed for mild pain or moderate pain.   Yes Historical Provider, MD   acidophilus (RISAQUAD) CAPS capsule Take 1 capsule by mouth 3 (three) times daily.   Yes Historical Provider, MD  aspirin EC 325 MG tablet Take 325 mg by mouth daily.   Yes Historical Provider, MD  baclofen (LIORESAL) 10 MG tablet Take 5 mg by mouth 3 (three) times daily.   Yes Historical Provider, MD  benztropine (COGENTIN) 0.5 MG tablet Take 0.5 mg by mouth 2 (two) times daily.   Yes Historical Provider, MD  carbidopa-levodopa (SINEMET IR) 25-100 MG tablet Take 2 tablets by mouth 3 (three) times daily.   Yes Historical Provider, MD  docusate sodium (COLACE) 100 MG capsule Take 100 mg by mouth 2 (two) times daily.   Yes Historical Provider, MD  dorzolamide (TRUSOPT) 2 % ophthalmic solution Place 1 drop into both eyes 3 (three) times daily.   Yes Historical Provider, MD  DULoxetine (CYMBALTA) 60 MG capsule Take 60 mg by mouth daily.   Yes Historical Provider, MD  gabapentin (NEURONTIN) 600 MG tablet Take 600 mg by mouth 3 (three) times daily.   Yes Historical Provider, MD  latanoprost (XALATAN) 0.005 % ophthalmic solution Place 1 drop into both eyes at bedtime.   Yes Historical Provider, MD  LORazepam (ATIVAN) 0.5 MG tablet Take 0.5 mg by mouth every 6 (six) hours as needed for anxiety.   Yes Historical Provider,  MD  metoprolol tartrate (LOPRESSOR) 25 MG tablet Take 25 mg by mouth 2 (two) times daily.    Yes Historical Provider, MD  nitroGLYCERIN (NITROSTAT) 0.4 MG SL tablet Place 0.4 mg under the tongue every 5 (five) minutes as needed for chest pain.   Yes Historical Provider, MD  paliperidone (INVEGA) 6 MG 24 hr tablet Take 12 mg by mouth daily.   Yes Historical Provider, MD  QUEtiapine (SEROQUEL) 50 MG tablet Take 50 mg by mouth 2 (two) times daily.   Yes Historical Provider, MD  traMADol (ULTRAM) 50 MG tablet Take 50 mg by mouth daily as needed for moderate pain.   Yes Historical Provider, MD  Vitamin D, Ergocalciferol, (DRISDOL) 50000 units CAPS capsule Take 50,000 Units by mouth every 7  (seven) days. On Wednesday's.   Yes Historical Provider, MD    Family History Family History  Problem Relation Age of Onset  . Heart disease Mother     Pt unsure of details  . CAD Father     "hardening of the arteries" at 8, died at 8 of cancer  . Cancer Father     Social History Social History  Substance Use Topics  . Smoking status: Former Games developer  . Smokeless tobacco: Never Used     Comment: Smoked for 25 yrs  . Alcohol use No     Allergies   Fish-derived products   Review of Systems Review of Systems  Constitutional: Negative for appetite change and fatigue.  HENT: Negative for congestion, ear discharge and sinus pressure.   Eyes: Negative for discharge.  Respiratory: Negative for cough and shortness of breath.   Cardiovascular: Negative for chest pain.  Gastrointestinal: Negative for abdominal pain and diarrhea.  Genitourinary: Negative for frequency and hematuria.  Musculoskeletal: Negative for back pain.  Skin: Negative for rash.  Neurological: Positive for weakness. Negative for seizures and headaches.  Psychiatric/Behavioral: Negative for hallucinations.     Physical Exam Updated Vital Signs BP 106/60   Pulse 65   Temp 98.3 F (36.8 C) (Oral)   Resp 16   Ht 5\' 5"  (1.651 m)   Wt 105 lb (47.6 kg)   LMP  (LMP Unknown) Comment: post menopausal  SpO2 97%   BMI 17.47 kg/m   Physical Exam  Constitutional: She is oriented to person, place, and time. She appears well-developed.  HENT:  Head: Normocephalic.  Eyes: Conjunctivae and EOM are normal. No scleral icterus.  Neck: Neck supple. No thyromegaly present.  Cardiovascular: Normal rate and regular rhythm.  Exam reveals no gallop and no friction rub.   No murmur heard. Pulmonary/Chest: No stridor. She has no wheezes. She has no rales. She exhibits no tenderness.  Abdominal: She exhibits no distension. There is no tenderness. There is no rebound.  Musculoskeletal: Normal range of motion. She exhibits  no edema.  Lymphadenopathy:    She has no cervical adenopathy.  Neurological: She is oriented to person, place, and time. She exhibits normal muscle tone. Coordination normal.  Skin: No rash noted. No erythema.  Psychiatric: She has a normal mood and affect. Her behavior is normal.     ED Treatments / Results  Labs (all labs ordered are listed, but only abnormal results are displayed) Labs Reviewed  COMPREHENSIVE METABOLIC PANEL - Abnormal; Notable for the following:       Result Value   Calcium 8.8 (*)    AST 13 (*)    ALT <5 (*)    All other components within normal limits  URINALYSIS, ROUTINE W REFLEX MICROSCOPIC (NOT AT [facility]) - Abnormal; Notable for the following:    Ketones, ur TRACE (*)    All other components within normal limits  CBC WITH DIFFERENTIAL/PLATELET  I-STAT CHEM 8, ED  I-STAT CG4 LACTIC ACID, ED  I-STAT CG4 LACTIC ACID, ED    EKG  EKG Interpretation  Date/Time:  Friday January 04 2016 15:54:16 EDT Ventricular Rate:  67 PR Interval:    QRS Duration: 81 QT Interval:  403 QTC Calculation: 426 R Axis:   86 Text Interpretation:  Sinus rhythm Probable left atrial enlargement Inferior infarct, acute (LCx) Minimal ST elevation, anterior leads Confirmed by Vendetta Pittinger  MD, Dylanie Quesenberry [phone]([id number]) on 01/04/2016 4:01:51 PM       Radiology Dg Chest Portable 1 View  Result Date: 01/04/2016 CLINICAL DATA:  Low blood pressure, dizziness, weakness, Parkinson's, coronary artery disease post NSTEMI, hypertension, former smoker EXAM: PORTABLE CHEST 1 VIEW COMPARISON:  Portable exam 1557 hours compared to 11/19/2015 FINDINGS: Normal heart size, mediastinal contours, and pulmonary vascularity. Lungs hyperinflated but clear. No pleural effusion or pneumothorax. Dextroconvex scoliosis with evidence of prior spinal fixation. Diffuse osseous demineralization. IMPRESSION: Emphysematous changes without acute infiltrate. Electronically Signed   By: Ulyses SouthwardMark  Boles M.D.   On: 01/04/2016 16:08     Procedures Procedures (including critical care time)  Medications Ordered in ED Medications  sodium chloride 0.9 % bolus 1,000 mL (0 mLs Intravenous Stopped 01/04/16 1711)  LORazepam (ATIVAN) tablet 0.5 mg (0.5 mg Oral Given 01/04/16 1710)  sodium chloride 0.9 % bolus 1,000 mL (1,000 mLs Intravenous New Bag/Given 01/04/16 1742)     Initial Impression / Assessment and Plan / ED Course  I have reviewed the triage vital signs and the nursing notes.  Pertinent labs & imaging results that were available during my care of the patient were reviewed by me and considered in my medical decision making (see chart for details).  Clinical Course    Patient with hypotension and weakness. Patient improved with IV fluids. Labs including urinalysis and troponin were unremarkable. EKG and chest x-ray unremarkable patient instructed to drink plenty of fluids and follow-up with her doctor next week for recheck `  Final Clinical Impressions(s) / ED Diagnoses   Final diagnoses:  Dehydration    New Prescriptions New Prescriptions   No medications on file     Bethann BerkshireJoseph Kileigh Ortmann, MD 01/04/16 1949

## 2016-01-04 NOTE — Discharge Instructions (Signed)
Drink plenty of fluids and follow-up with their family doctor next week

## 2016-01-04 NOTE — ED Triage Notes (Signed)
[facility] and had vital signs done which showed a low BP. Pt states she gets dizzy when moving from sitting to standing. Denies pain.

## 2016-01-04 NOTE — ED Notes (Signed)
Pt requesting medication for panic attack per Dr Estell HarpinZammit since BP up may have ativan 0.5 mg po

## 2018-06-01 IMAGING — NM NM MYOCAR MULTI W/SPECT W/WALL MOTION & EF
2 series · 12 of 12 positions shown · non-contrast
Comparison: none

[Series 1: rest · 8.28mm/px · 6 of 64 frames shown]
[frame 6/64]
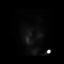
[frame 16/64]
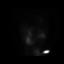
[frame 27/64]
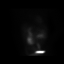
[frame 38/64]
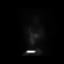
[frame 48/64]
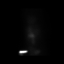
[frame 59/64]
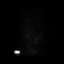

[Series 2: stress gated - perfusion · 8.28mm/px · 6 of 64 frames shown]
[frame 6/64]
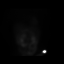
[frame 16/64]
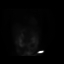
[frame 27/64]
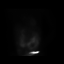
[frame 38/64]
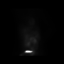
[frame 48/64]
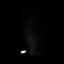
[frame 59/64]
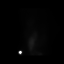

[12 of 12 positions shown; findings below may reference images not displayed]

Canned report from images found in remote index.

Refer to host system for actual result text.
